# Patient Record
Sex: Male | Born: 1940 | Race: White | Hispanic: No | Marital: Married | Smoking: Never smoker
Health system: Southern US, Community
[De-identification: ages and names within clinical notes are randomized; demographics above are authoritative.]

## PROBLEM LIST (undated history)

## (undated) HISTORY — PX: ESOPHAGOGASTRODUODENOSCOPY ENDOSCOPY: SHX5814

---

## 2011-02-23 ENCOUNTER — Emergency Department (HOSPITAL_COMMUNITY): Payer: Medicare Other

## 2011-02-23 ENCOUNTER — Encounter: Payer: Self-pay | Admitting: Emergency Medicine

## 2011-02-23 ENCOUNTER — Emergency Department (HOSPITAL_COMMUNITY)
Admission: EM | Admit: 2011-02-23 | Discharge: 2011-02-24 | Disposition: A | Discharge status: Home / Self Care | Payer: Medicare Other | Attending: Emergency Medicine | Admitting: Emergency Medicine

## 2011-02-23 DIAGNOSIS — S81009A Unspecified open wound, unspecified knee, initial encounter: Secondary | ICD-10-CM | POA: Insufficient documentation

## 2011-02-23 DIAGNOSIS — S40019A Contusion of unspecified shoulder, initial encounter: Secondary | ICD-10-CM

## 2011-02-23 DIAGNOSIS — S0990XA Unspecified injury of head, initial encounter: Secondary | ICD-10-CM

## 2011-02-23 DIAGNOSIS — Y92009 Unspecified place in unspecified non-institutional (private) residence as the place of occurrence of the external cause: Secondary | ICD-10-CM | POA: Insufficient documentation

## 2011-02-23 DIAGNOSIS — M25519 Pain in unspecified shoulder: Secondary | ICD-10-CM | POA: Insufficient documentation

## 2011-02-23 DIAGNOSIS — S0180XA Unspecified open wound of other part of head, initial encounter: Secondary | ICD-10-CM | POA: Insufficient documentation

## 2011-02-23 DIAGNOSIS — S81812A Laceration without foreign body, left lower leg, initial encounter: Secondary | ICD-10-CM

## 2011-02-23 DIAGNOSIS — S0181XA Laceration without foreign body of other part of head, initial encounter: Secondary | ICD-10-CM

## 2011-02-23 DIAGNOSIS — T7411XA Adult physical abuse, confirmed, initial encounter: Secondary | ICD-10-CM | POA: Insufficient documentation

## 2011-02-23 MED ORDER — MORPHINE SULFATE 4 MG/ML IJ SOLN
4.0000 mg | Freq: Once | INTRAMUSCULAR | Status: DC
  Filled 2011-02-23: qty 1

## 2011-02-23 MED ORDER — LIDOCAINE HCL (PF) 1 % IJ SOLN: 5.0000 mL | Freq: Once | INTRAMUSCULAR | Status: DC

## 2011-02-23 MED ORDER — MORPHINE SULFATE 4 MG/ML IJ SOLN
4.0000 mg | Freq: Once | INTRAMUSCULAR | Status: AC
  Administered 2011-02-23: 4 mg via INTRAMUSCULAR

## 2011-02-23 MED ORDER — TETANUS-DIPHTH-ACELL PERTUSSIS 5-2.5-18.5 LF-MCG/0.5 IM SUSP
0.5000 mL | Freq: Once | INTRAMUSCULAR | Status: AC
  Administered 2011-02-23: 0.5 mL via INTRAMUSCULAR
  Filled 2011-02-23: qty 0.5

## 2011-02-23 MED ORDER — LIDOCAINE-EPINEPHRINE (PF) 1 %-1:200000 IJ SOLN
INTRAMUSCULAR | Status: AC
  Filled 2011-02-23: qty 10
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
## 2011-02-23 NOTE — ED Notes (Signed)
Patient ambulated to bathroom to clean arms and hands; c/o mild dizziness.

## 2011-02-24 MED ORDER — NAPROXEN 500 MG PO TABS: 500.0000 mg | ORAL_TABLET | Freq: Two times a day (BID) | ORAL | Status: AC

## 2011-02-24 NOTE — ED Notes (Signed)
Pt self ambualte out with a steady gait stating no needs

## 2014-07-27 ENCOUNTER — Encounter (HOSPITAL_COMMUNITY): Payer: Self-pay | Admitting: Emergency Medicine

## 2014-07-27 ENCOUNTER — Emergency Department (HOSPITAL_COMMUNITY)
Admission: EM | Admit: 2014-07-27 | Discharge: 2014-07-27 | Disposition: A | Discharge status: Home / Self Care | Payer: PPO | Attending: Emergency Medicine | Admitting: Emergency Medicine

## 2014-07-27 ENCOUNTER — Emergency Department (HOSPITAL_COMMUNITY): Payer: PPO

## 2014-07-27 DIAGNOSIS — S30811A Abrasion of abdominal wall, initial encounter: Secondary | ICD-10-CM | POA: Diagnosis not present

## 2014-07-27 DIAGNOSIS — Y9389 Activity, other specified: Secondary | ICD-10-CM | POA: Insufficient documentation

## 2014-07-27 DIAGNOSIS — Z23 Encounter for immunization: Secondary | ICD-10-CM | POA: Insufficient documentation

## 2014-07-27 DIAGNOSIS — Y998 Other external cause status: Secondary | ICD-10-CM | POA: Insufficient documentation

## 2014-07-27 DIAGNOSIS — S31139A Puncture wound of abdominal wall without foreign body, unspecified quadrant without penetration into peritoneal cavity, initial encounter: Secondary | ICD-10-CM | POA: Insufficient documentation

## 2014-07-27 DIAGNOSIS — Y9289 Other specified places as the place of occurrence of the external cause: Secondary | ICD-10-CM | POA: Diagnosis not present

## 2014-07-27 DIAGNOSIS — S51812A Laceration without foreign body of left forearm, initial encounter: Secondary | ICD-10-CM | POA: Insufficient documentation

## 2014-07-27 DIAGNOSIS — S51811A Laceration without foreign body of right forearm, initial encounter: Secondary | ICD-10-CM | POA: Insufficient documentation

## 2014-07-27 MED ORDER — TETANUS-DIPHTH-ACELL PERTUSSIS 5-2.5-18.5 LF-MCG/0.5 IM SUSP
0.5000 mL | Freq: Once | INTRAMUSCULAR | Status: AC
  Administered 2014-07-27: 0.5 mL via INTRAMUSCULAR
  Filled 2014-07-27: qty 0.5

## 2014-07-27 MED ORDER — ACETAMINOPHEN 325 MG PO TABS
650.0000 mg | ORAL_TABLET | Freq: Once | ORAL | Status: AC
  Administered 2014-07-27: 650 mg via ORAL
  Filled 2014-07-27: qty 2
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
## 2014-07-27 NOTE — Discharge Instructions (Signed)
Laceration Care, Adult °A laceration is a cut that goes through all layers of the skin. The cut goes into the tissue beneath the skin. °HOME CARE °For stitches (sutures) or staples: °· Keep the cut clean and dry. °· If you have a bandage (dressing), change it at least once a day. Change the bandage if it gets wet or dirty, or as told by your doctor. °· Wash the cut with soap and water 2 times a day. Rinse the cut with water. Pat it dry with a clean towel. °· Put a thin layer of medicated cream on the cut as told by your doctor. °· You may shower after the first 24 hours. Do not soak the cut in water until the stitches are removed. °· Only take medicines as told by your doctor. °· Have your stitches or staples removed as told by your doctor. °For skin adhesive strips: °· Keep the cut clean and dry. °· Do not get the strips wet. You may take a bath, but be careful to keep the cut dry. °· If the cut gets wet, pat it dry with a clean towel. °· The strips will fall off on their own. Do not remove the strips that are still stuck to the cut. °For wound glue: °· You may shower or take baths. Do not soak or scrub the cut. Do not swim. Avoid heavy sweating until the glue falls off on its own. After a shower or bath, pat the cut dry with a clean towel. °· Do not put medicine on your cut until the glue falls off. °· If you have a bandage, do not put tape over the glue. °· Avoid lots of sunlight or tanning lamps until the glue falls off. Put sunscreen on the cut for the first year to reduce your scar. °· The glue will fall off on its own. Do not pick at the glue. °You may need a tetanus shot if: °· You cannot remember when you had your last tetanus shot. °· You have never had a tetanus shot. °If you need a tetanus shot and you choose not to have one, you may get tetanus. Sickness from tetanus can be serious. °GET HELP RIGHT AWAY IF:  °· Your pain does not get better with medicine. °· Your arm, hand, leg, or foot loses feeling  (numbness) or changes color. °· Your cut is bleeding. °· Your joint feels weak, or you cannot use your joint. °· You have painful lumps on your body. °· Your cut is red, puffy (swollen), or painful. °· You have a red line on the skin near the cut. °· You have yellowish-white fluid (pus) coming from the cut. °· You have a fever. °· You have a bad smell coming from the cut or bandage. °· Your cut breaks open before or after stitches are removed. °· You notice something coming out of the cut, such as wood or glass. °· You cannot move a finger or toe. °MAKE SURE YOU:  °· Understand these instructions. °· Will watch your condition. °· Will get help right away if you are not doing well or get worse. °Document Released: 11/27/2007 Document Revised: 09/02/2011 Document Reviewed: 12/04/2010 °[employer]® Patient Information ©2015 [employer]. This information is not intended to replace advice given to you by your health care provider. Make sure you discuss any questions you have with your health care provider. ° °Sterile Tape Wound Care °Some cuts and wounds can be closed using sterile tape, also called skin adhesive   strips. Skin adhesive strips can be used for shallow (superficial) and simple cuts, wounds, lacerations, and surgical incisions. These strips act in place of stitches to hold the edges of the wound together, allowing for faster healing. Unlike stitches, the adhesive strips do not require needles or anesthetic medicine for placement. The strips will wear off naturally as the wound is healing. It is important to take proper care of your wound at home while it heals.  °HOME CARE INSTRUCTIONS °· Try to keep the area around your wound clean and dry. Do not allow the adhesive strips to get wet for the first 12 hours.   °· Do not use any soaps or ointments on the wound for the first 12 hours.   °· If a bandage (dressing) has been applied, follow your health care provider's instructions for how often to change the  dressing. Keep the dressing dry if one has been applied.   °· Do not remove the adhesive strips. They will fall off on their own. If they do not, you may remove them gently after 10 days. You should gently wet the strips before removing them. For example, this can be done in the shower. °· Do not scratch, pick, or rub the wound area.   °· Protect the wound from further injury until it is healed.   °· Protect the wound from sun and tanning bed exposure while it is healing and for several weeks after healing.   °· Only take over-the-counter or prescription medicines as directed by your health care provider.   °· Keep all follow-up appointments as directed by your health care provider.   °SEEK MEDICAL CARE IF: °Your adhesive strips become wet or soaked with blood before the wound has healed. The tape will need to be replaced.  °SEEK IMMEDIATE MEDICAL CARE IF: °· You have increasing pain in the wound.   °· You develop a rash after the strips are applied. °· Your wound becomes red, swollen, hot, or tender.   °· You have a red streak that goes away from the wound.   °· You have pus coming from the wound.   °· You have increased bleeding from the wound. °· You notice a bad smell coming from the wound.   °· Your wound breaks open. °MAKE SURE YOU: °· Understand these instructions. °· Will watch your condition. °· Will get help right away if you are not doing well or get worse. °Document Released: 07/18/2004 Document Revised: 03/31/2013 Document Reviewed: 12/30/2012 °Exit[employer]® Patient Information ©2015 [employer]. This information is not intended to replace advice given to you by your health care provider. Make sure you discuss any questions you have with your health care provider. ° °
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
## 2014-08-23 ENCOUNTER — Telehealth: Payer: Self-pay | Admitting: Family Medicine

## 2014-08-25 ENCOUNTER — Telehealth: Payer: Self-pay | Admitting: Family Medicine

## 2014-08-25 NOTE — Telephone Encounter (Signed)
Patient is going to call us back if he decides he wants to be seen.

## 2014-08-26 NOTE — Telephone Encounter (Signed)
Pt given new pt appt with Dr Darlyn ReadStacks 4/15 at 10:10. Pt aware to arrive 15 minutes prior with a copy of insurance card, pt not on any current meds.

## 2014-10-07 ENCOUNTER — Ambulatory Visit: Payer: Self-pay | Admitting: Family Medicine

## 2015-01-07 ENCOUNTER — Emergency Department (HOSPITAL_COMMUNITY)
Admission: EM | Admit: 2015-01-07 | Discharge: 2015-01-08 | Disposition: A | Discharge status: Home / Self Care | Payer: PPO | Attending: Emergency Medicine | Admitting: Emergency Medicine

## 2015-01-07 ENCOUNTER — Encounter (HOSPITAL_COMMUNITY): Payer: Self-pay | Admitting: *Deleted

## 2015-01-07 DIAGNOSIS — Y998 Other external cause status: Secondary | ICD-10-CM | POA: Insufficient documentation

## 2015-01-07 DIAGNOSIS — S41101A Unspecified open wound of right upper arm, initial encounter: Secondary | ICD-10-CM | POA: Diagnosis not present

## 2015-01-07 DIAGNOSIS — S3992XA Unspecified injury of lower back, initial encounter: Secondary | ICD-10-CM | POA: Insufficient documentation

## 2015-01-07 DIAGNOSIS — S01112A Laceration without foreign body of left eyelid and periocular area, initial encounter: Secondary | ICD-10-CM | POA: Insufficient documentation

## 2015-01-07 DIAGNOSIS — S3991XA Unspecified injury of abdomen, initial encounter: Secondary | ICD-10-CM | POA: Diagnosis not present

## 2015-01-07 DIAGNOSIS — Y9289 Other specified places as the place of occurrence of the external cause: Secondary | ICD-10-CM | POA: Diagnosis not present

## 2015-01-07 DIAGNOSIS — S20212A Contusion of left front wall of thorax, initial encounter: Secondary | ICD-10-CM | POA: Insufficient documentation

## 2015-01-07 DIAGNOSIS — S29092A Other injury of muscle and tendon of back wall of thorax, initial encounter: Secondary | ICD-10-CM | POA: Diagnosis not present

## 2015-01-07 DIAGNOSIS — S4992XA Unspecified injury of left shoulder and upper arm, initial encounter: Secondary | ICD-10-CM | POA: Diagnosis present

## 2015-01-07 DIAGNOSIS — T148XXA Other injury of unspecified body region, initial encounter: Secondary | ICD-10-CM

## 2015-01-07 DIAGNOSIS — T07XXXA Unspecified multiple injuries, initial encounter: Secondary | ICD-10-CM

## 2015-01-07 DIAGNOSIS — S41102A Unspecified open wound of left upper arm, initial encounter: Secondary | ICD-10-CM | POA: Diagnosis not present

## 2015-01-07 DIAGNOSIS — S0181XA Laceration without foreign body of other part of head, initial encounter: Secondary | ICD-10-CM

## 2015-01-07 DIAGNOSIS — S5002XA Contusion of left elbow, initial encounter: Secondary | ICD-10-CM | POA: Insufficient documentation

## 2015-01-07 DIAGNOSIS — Y9389 Activity, other specified: Secondary | ICD-10-CM | POA: Diagnosis not present

## 2015-01-07 DIAGNOSIS — S0990XA Unspecified injury of head, initial encounter: Secondary | ICD-10-CM

## 2015-01-07 MED ORDER — BACITRACIN-POLYMYXIN B 500-10000 UNIT/GM OP OINT
TOPICAL_OINTMENT | OPHTHALMIC | Status: AC
  Administered 2015-01-08
  Filled 2015-01-07: qty 3.5
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
## 2015-01-08 ENCOUNTER — Emergency Department (HOSPITAL_COMMUNITY): Payer: PPO

## 2015-01-08 LAB — URINALYSIS, ROUTINE W REFLEX MICROSCOPIC
Bilirubin Urine: NEGATIVE
GLUCOSE, UA: NEGATIVE mg/dL
KETONES UR: NEGATIVE mg/dL
Leukocytes, UA: NEGATIVE
Nitrite: NEGATIVE
PROTEIN: NEGATIVE mg/dL
Specific Gravity, Urine: 1.02 (ref 1.005–1.030)
UROBILINOGEN UA: 0.2 mg/dL (ref 0.0–1.0)
pH: 5.5 (ref 5.0–8.0)

## 2015-01-08 LAB — COMPREHENSIVE METABOLIC PANEL
ALBUMIN: 4.2 g/dL (ref 3.5–5.0)
ALT: 20 U/L (ref 17–63)
AST: 25 U/L (ref 15–41)
Alkaline Phosphatase: 71 U/L (ref 38–126)
Anion gap: 9 (ref 5–15)
BILIRUBIN TOTAL: 0.8 mg/dL (ref 0.3–1.2)
BUN: 18 mg/dL (ref 6–20)
CHLORIDE: 105 mmol/L (ref 101–111)
CO2: 24 mmol/L (ref 22–32)
CREATININE: 1.22 mg/dL (ref 0.61–1.24)
Calcium: 8.8 mg/dL — ABNORMAL LOW (ref 8.9–10.3)
GFR calc Af Amer: >60 mL/min (ref >60)
GFR, EST NON AFRICAN AMERICAN: 56 mL/min — AB (ref >60)
Glucose, Bld: 111 mg/dL — ABNORMAL HIGH (ref 65–99)
POTASSIUM: 4.2 mmol/L (ref 3.5–5.1)
SODIUM: 138 mmol/L (ref 135–145)
Total Protein: 7.3 g/dL (ref 6.5–8.1)

## 2015-01-08 LAB — URINE MICROSCOPIC-ADD ON

## 2015-01-08 LAB — PROTIME-INR
INR: 1.06 (ref 0.00–1.49)
PROTHROMBIN TIME: 14 s (ref 11.6–15.2)

## 2015-01-08 LAB — CBC
HCT: 44.1 % (ref 39.0–52.0)
HEMOGLOBIN: 15 g/dL (ref 13.0–17.0)
MCH: 31.4 pg (ref 26.0–34.0)
MCHC: 34 g/dL (ref 30.0–36.0)
MCV: 92.5 fL (ref 78.0–100.0)
PLATELETS: 172 10*3/uL (ref 150–400)
RBC: 4.77 MIL/uL (ref 4.22–5.81)
RDW: 13.7 % (ref 11.5–15.5)
WBC: 9.7 10*3/uL (ref 4.0–10.5)

## 2015-01-08 LAB — SAMPLE TO BLOOD BANK

## 2015-01-08 MED ORDER — SODIUM CHLORIDE 0.9 % IV SOLN
INTRAVENOUS | Status: DC
  Administered 2015-01-08: via INTRAVENOUS

## 2015-01-08 MED ORDER — ONDANSETRON 4 MG PO TBDP: 4.0000 mg | ORAL_TABLET | Freq: Three times a day (TID) | ORAL | Status: DC | PRN

## 2015-01-08 MED ORDER — ONDANSETRON HCL 4 MG/2ML IJ SOLN
4.0000 mg | Freq: Once | INTRAMUSCULAR | Status: AC
Start: 2015-01-08 — End: 2015-01-08
  Administered 2015-01-08: 4 mg via INTRAVENOUS
  Filled 2015-01-08: qty 2

## 2015-01-08 MED ORDER — DOCUSATE SODIUM 100 MG PO CAPS: 100.0000 mg | ORAL_CAPSULE | Freq: Two times a day (BID) | ORAL | Status: DC

## 2015-01-08 MED ORDER — LIDOCAINE HCL (PF) 1 % IJ SOLN
INTRAMUSCULAR | Status: AC
  Administered 2015-01-08: 01:00:00
  Filled 2015-01-08: qty 5

## 2015-01-08 MED ORDER — FENTANYL CITRATE (PF) 100 MCG/2ML IJ SOLN
50.0000 ug | Freq: Once | INTRAMUSCULAR | Status: AC
  Administered 2015-01-08: 50 ug via INTRAVENOUS
  Filled 2015-01-08: qty 2

## 2015-01-08 MED ORDER — HYDROCODONE-ACETAMINOPHEN 5-325 MG PO TABS: 1.0000 | ORAL_TABLET | Freq: Four times a day (QID) | ORAL | Status: DC | PRN

## 2015-01-08 MED ORDER — LIDOCAINE-EPINEPHRINE (PF) 1 %-1:200000 IJ SOLN
10.0000 mL | Freq: Once | INTRAMUSCULAR | Status: AC
  Administered 2015-01-08: 10 mL via INTRADERMAL

## 2015-01-08 MED ORDER — IOHEXOL 300 MG/ML  SOLN
100.0000 mL | Freq: Once | INTRAMUSCULAR | Status: AC | PRN
  Administered 2015-01-08: 100 mL via INTRAVENOUS

## 2015-01-08 NOTE — Discharge Instructions (Signed)
Contusion A contusion is a deep bruise. Contusions are the result of an injury that caused bleeding under the skin. The contusion may turn blue, purple, or yellow. Minor injuries will give you a painless contusion, but more severe contusions may stay painful and swollen for a few weeks.  CAUSES  A contusion is usually caused by a blow, trauma, or direct force to an area of the body. SYMPTOMS   Swelling and redness of the injured area.  Bruising of the injured area.  Tenderness and soreness of the injured area.  Pain. DIAGNOSIS  The diagnosis can be made by taking a history and physical exam. An X-ray, CT scan, or MRI may be needed to determine if there were any associated injuries, such as fractures. TREATMENT  Specific treatment will depend on what area of the body was injured. In general, the best treatment for a contusion is resting, icing, elevating, and applying cold compresses to the injured area. Over-the-counter medicines may also be recommended for pain control. Ask your caregiver what the best treatment is for your contusion. HOME CARE INSTRUCTIONS   Put ice on the injured area.  Put ice in a plastic bag.  Place a towel between your skin and the bag.  Leave the ice on for 15-20 minutes, 3-4 times a day, or as directed by your health care provider.  Only take over-the-counter or prescription medicines for pain, discomfort, or fever as directed by your caregiver. Your caregiver may recommend avoiding anti-inflammatory medicines (aspirin, ibuprofen, and naproxen) for 48 hours because these medicines may increase bruising.  Rest the injured area.  If possible, elevate the injured area to reduce swelling. SEEK IMMEDIATE MEDICAL CARE IF:   You have increased bruising or swelling.  You have pain that is getting worse.  Your swelling or pain is not relieved with medicines. MAKE SURE YOU:   Understand these instructions.  Will watch your condition.  Will get help right  away if you are not doing well or get worse. Document Released: 03/20/2005 Document Revised: 06/15/2013 Document Reviewed: 04/15/2011 [facility] Patient Information 2015 [place], [place]. This information is not intended to replace advice given to you by your health care provider. Make sure you discuss any questions you have with your health care provider.  Head Injury You have received a head injury. It does not appear serious at this time. Headaches and vomiting are common following head injury. It should be easy to awaken from sleeping. Sometimes it is necessary for you to stay in the emergency department for a while for observation. Sometimes admission to the hospital may be needed. After injuries such as yours, most problems occur within the first 24 hours, but side effects may occur up to 7-10 days after the injury. It is important for you to carefully monitor your condition and contact your health care provider or seek immediate medical care if there is a change in your condition. WHAT ARE THE TYPES OF HEAD INJURIES? Head injuries can be as minor as a bump. Some head injuries can be more severe. More severe head injuries include:  A jarring injury to the brain (concussion).  A bruise of the brain (contusion). This mean there is bleeding in the brain that can cause swelling.  A cracked skull (skull fracture).  Bleeding in the brain that collects, clots, and forms a bump (hematoma). WHAT CAUSES A HEAD INJURY? A serious head injury is most likely to happen to someone who is in a car wreck and is not wearing  a seat belt. Other causes of major head injuries include bicycle or motorcycle accidents, sports injuries, and falls. HOW ARE HEAD INJURIES DIAGNOSED? A complete history of the event leading to the injury and your current symptoms will be helpful in diagnosing head injuries. Many times, pictures of the brain, such as CT or MRI are needed to see the extent of the injury. Often, an overnight  hospital stay is necessary for observation.  WHEN SHOULD I SEEK IMMEDIATE MEDICAL CARE?  You should get help right away if:  You have confusion or drowsiness.  You feel sick to your stomach (nauseous) or have continued, forceful vomiting.  You have dizziness or unsteadiness that is getting worse.  You have severe, continued headaches not relieved by medicine. Only take over-the-counter or prescription medicines for pain, fever, or discomfort as directed by your health care provider.  You do not have normal function of the arms or legs or are unable to walk.  You notice changes in the black spots in the center of the colored part of your eye (pupil).  You have a clear or bloody fluid coming from your nose or ears.  You have a loss of vision. During the next 24 hours after the injury, you must stay with someone who can watch you for the warning signs. This person should contact local emergency services (911 in the [place] if you have seizures, you become unconscious, or you are unable to wake up. HOW CAN I PREVENT A HEAD INJURY IN THE FUTURE? The most important factor for preventing major head injuries is avoiding motor vehicle accidents. To minimize the potential for damage to your head, it is crucial to wear seat belts while riding in motor vehicles. Wearing helmets while bike riding and playing collision sports (like football) is also helpful. Also, avoiding dangerous activities around the house will further help reduce your risk of head injury.  WHEN CAN I RETURN TO NORMAL ACTIVITIES AND ATHLETICS? You should be reevaluated by your health care provider before returning to these activities. If you have any of the following symptoms, you should not return to activities or contact sports until 1 week after the symptoms have stopped:  Persistent headache.  Dizziness or vertigo.  Poor attention and concentration.  Confusion.  Memory problems.  Nausea or vomiting.  Fatigue or tire  easily.  Irritability.  Intolerant of bright lights or loud noises.  Anxiety or depression.  Disturbed sleep. MAKE SURE YOU:   Understand these instructions.  Will watch your condition.  Will get help right away if you are not doing well or get worse. Document Released: 06/10/2005 Document Revised: 06/15/2013 Document Reviewed: 02/15/2013 [facility] Patient Information 2015 [place], [place]. This information is not intended to replace advice given to you by your health care provider. Make sure you discuss any questions you have with your health care provider.  Facial Laceration  A facial laceration is a cut on the face. These injuries can be painful and cause bleeding. Lacerations usually heal quickly, but they need special care to reduce scarring. DIAGNOSIS  Your health care provider will take a medical history, ask for details about how the injury occurred, and examine the wound to determine how deep the cut is. TREATMENT  Some facial lacerations may not require closure. Others may not be able to be closed because of an increased risk of infection. The risk of infection and the chance for successful closure will depend on various factors, including the amount of time since the injury  occurred. The wound may be cleaned to help prevent infection. If closure is appropriate, pain medicines may be given if needed. Your health care provider will use stitches (sutures), wound glue (adhesive), or skin adhesive strips to repair the laceration. These tools bring the skin edges together to allow for faster healing and a better cosmetic outcome. If needed, you may also be given a tetanus shot. HOME CARE INSTRUCTIONS  Only take over-the-counter or prescription medicines as directed by your health care provider.  Follow your health care provider's instructions for wound care. These instructions will vary depending on the technique used for closing the wound. For Sutures:  Keep the wound clean and  dry.   If you were given a bandage (dressing), you should change it at least once a day. Also change the dressing if it becomes wet or dirty, or as directed by your health care provider.   Wash the wound with soap and water 2 times a day. Rinse the wound off with water to remove all soap. Pat the wound dry with a clean towel.   After cleaning, apply a thin layer of the antibiotic ointment recommended by your health care provider. This will help prevent infection and keep the dressing from sticking.   You may shower as usual after the first 24 hours. Do not soak the wound in water until the sutures are removed.   Get your sutures removed as directed by your health care provider. With facial lacerations, sutures should usually be taken out after 4-5 days to avoid stitch marks.   Wait a few days after your sutures are removed before applying any makeup. For Skin Adhesive Strips:  Keep the wound clean and dry.   Do not get the skin adhesive strips wet. You may bathe carefully, using caution to keep the wound dry.   If the wound gets wet, pat it dry with a clean towel.   Skin adhesive strips will fall off on their own. You may trim the strips as the wound heals. Do not remove skin adhesive strips that are still stuck to the wound. They will fall off in time.  For Wound Adhesive:  You may briefly wet your wound in the shower or bath. Do not soak or scrub the wound. Do not swim. Avoid periods of heavy sweating until the skin adhesive has fallen off on its own. After showering or bathing, gently pat the wound dry with a clean towel.   Do not apply liquid medicine, cream medicine, ointment medicine, or makeup to your wound while the skin adhesive is in place. This may loosen the film before your wound is healed.   If a dressing is placed over the wound, be careful not to apply tape directly over the skin adhesive. This may cause the adhesive to be pulled off before the wound is healed.    Avoid prolonged exposure to sunlight or tanning lamps while the skin adhesive is in place.  The skin adhesive will usually remain in place for 5-10 days, then naturally fall off the skin. Do not pick at the adhesive film.  After Healing: Once the wound has healed, cover the wound with sunscreen during the day for 1 full year. This can help minimize scarring. Exposure to ultraviolet light in the first year will darken the scar. It can take 1-2 years for the scar to lose its redness and to heal completely.  SEEK IMMEDIATE MEDICAL CARE IF:  You have redness, pain, or swelling around the wound.  You see ayellowish-white fluid (pus) coming from the wound.   You have chills or a fever.  MAKE SURE YOU:  Understand these instructions.  Will watch your condition.  Will get help right away if you are not doing well or get worse. Document Released: 07/18/2004 Document Revised: 03/31/2013 Document Reviewed: 01/21/2013 [facility] Patient Information 2015 [place], [place]. This information is not intended to replace advice given to you by your health care provider. Make sure you discuss any questions you have with your health care provider.   RICE: Routine Care for Injuries The routine care of many injuries includes Rest, Ice, Compression, and Elevation (RICE). HOME CARE INSTRUCTIONS  Rest is needed to allow your body to heal. Routine activities can usually be resumed when comfortable. Injured tendons and bones can take up to 6 weeks to heal. Tendons are the cord-like structures that attach muscle to bone.  Ice following an injury helps keep the swelling down and reduces pain.  Put ice in a plastic bag.  Place a towel between your skin and the bag.  Leave the ice on for 15-20 minutes, 3-4 times a day, or as directed by your health care provider. Do this while awake, for the first 24 to 48 hours. After that, continue as directed by your caregiver.  Compression helps keep swelling down.  It also gives support and helps with discomfort. If an elastic bandage has been applied, it should be removed and reapplied every 3 to 4 hours. It should not be applied tightly, but firmly enough to keep swelling down. Watch fingers or toes for swelling, bluish discoloration, coldness, numbness, or excessive pain. If any of these problems occur, remove the bandage and reapply loosely. Contact your caregiver if these problems continue.  Elevation helps reduce swelling and decreases pain. With extremities, such as the arms, hands, legs, and feet, the injured area should be placed near or above the level of the heart, if possible. SEEK IMMEDIATE MEDICAL CARE IF:  You have persistent pain and swelling.  You develop redness, numbness, or unexpected weakness.  Your symptoms are getting worse rather than improving after several days. These symptoms may indicate that further evaluation or further X-rays are needed. Sometimes, X-rays may not show a small broken bone (fracture) until 1 week or 10 days later. Make a follow-up appointment with your caregiver. Ask when your X-ray results will be ready. Make sure you get your X-ray results. Document Released: 09/22/2000 Document Revised: 06/15/2013 Document Reviewed: 11/09/2010 [facility] Patient Information 2015 [place], [place]. This information is not intended to replace advice given to you by your health care provider. Make sure you discuss any questions you have with your health care provider.
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
## 2015-01-12 ENCOUNTER — Encounter (HOSPITAL_COMMUNITY): Payer: Self-pay | Admitting: Emergency Medicine

## 2015-01-12 ENCOUNTER — Emergency Department (HOSPITAL_COMMUNITY)
Admission: EM | Admit: 2015-01-12 | Discharge: 2015-01-12 | Disposition: A | Discharge status: Home / Self Care | Payer: PPO | Attending: Emergency Medicine | Admitting: Emergency Medicine

## 2015-01-12 DIAGNOSIS — Z4802 Encounter for removal of sutures: Secondary | ICD-10-CM | POA: Insufficient documentation

## 2015-01-12 NOTE — Discharge Instructions (Signed)
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
## 2016-01-31 ENCOUNTER — Encounter (HOSPITAL_COMMUNITY): Payer: Self-pay | Admitting: *Deleted

## 2016-01-31 ENCOUNTER — Emergency Department (HOSPITAL_COMMUNITY): Payer: PPO

## 2016-01-31 ENCOUNTER — Emergency Department (HOSPITAL_COMMUNITY)
Admission: EM | Admit: 2016-01-31 | Discharge: 2016-01-31 | Disposition: A | Discharge status: Home / Self Care | Payer: PPO | Attending: Emergency Medicine | Admitting: Emergency Medicine

## 2016-01-31 DIAGNOSIS — R0789 Other chest pain: Secondary | ICD-10-CM | POA: Diagnosis not present

## 2016-01-31 DIAGNOSIS — Z79899 Other long term (current) drug therapy: Secondary | ICD-10-CM | POA: Insufficient documentation

## 2016-01-31 DIAGNOSIS — M5412 Radiculopathy, cervical region: Secondary | ICD-10-CM | POA: Diagnosis not present

## 2016-01-31 DIAGNOSIS — R079 Chest pain, unspecified: Secondary | ICD-10-CM | POA: Diagnosis not present

## 2016-01-31 DIAGNOSIS — M542 Cervicalgia: Secondary | ICD-10-CM | POA: Diagnosis not present

## 2016-01-31 LAB — CBC
HCT: 42.6 % (ref 39.0–52.0)
HEMOGLOBIN: 14.3 g/dL (ref 13.0–17.0)
MCH: 31.2 pg (ref 26.0–34.0)
MCHC: 33.6 g/dL (ref 30.0–36.0)
MCV: 92.8 fL (ref 78.0–100.0)
Platelets: 151 10*3/uL (ref 150–400)
RBC: 4.59 MIL/uL (ref 4.22–5.81)
RDW: 14.9 % (ref 11.5–15.5)
WBC: 6.7 10*3/uL (ref 4.0–10.5)

## 2016-01-31 LAB — BASIC METABOLIC PANEL
ANION GAP: 7 (ref 5–15)
BUN: 14 mg/dL (ref 6–20)
CHLORIDE: 109 mmol/L (ref 101–111)
CO2: 22 mmol/L (ref 22–32)
Calcium: 9 mg/dL (ref 8.9–10.3)
Creatinine, Ser: 1.05 mg/dL (ref 0.61–1.24)
GFR calc Af Amer: >60 mL/min (ref >60)
GFR calc non Af Amer: >60 mL/min (ref >60)
Glucose, Bld: 117 mg/dL — ABNORMAL HIGH (ref 65–99)
POTASSIUM: 3.8 mmol/L (ref 3.5–5.1)
SODIUM: 138 mmol/L (ref 135–145)

## 2016-01-31 LAB — TROPONIN I: Troponin I: <0.03 ng/mL (ref <0.03)

## 2016-01-31 MED ORDER — DEXAMETHASONE SODIUM PHOSPHATE 10 MG/ML IJ SOLN
10.0000 mg | Freq: Once | INTRAMUSCULAR | Status: AC
  Administered 2016-01-31: 10 mg via INTRAMUSCULAR
  Filled 2016-01-31: qty 1

## 2016-01-31 MED ORDER — PREDNISONE 10 MG (21) PO TBPK: 10.0000 mg | ORAL_TABLET | Freq: Every day | ORAL | 0 refills | Status: DC

## 2016-01-31 MED ORDER — DEXAMETHASONE SODIUM PHOSPHATE 10 MG/ML IJ SOLN: 10.0000 mg | Freq: Once | INTRAMUSCULAR | Status: DC
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
## 2016-01-31 NOTE — ED Triage Notes (Signed)
Pt comes in with intermittent chest pain that he says comes in "spells". Pt states his pain moves up his right arm, around his head, down his left arm, and into his chest. Pt denies having this pain right now.
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
## 2018-07-27 DIAGNOSIS — H2513 Age-related nuclear cataract, bilateral: Secondary | ICD-10-CM | POA: Diagnosis not present

## 2018-07-27 DIAGNOSIS — H02403 Unspecified ptosis of bilateral eyelids: Secondary | ICD-10-CM | POA: Diagnosis not present

## 2018-07-27 DIAGNOSIS — H02831 Dermatochalasis of right upper eyelid: Secondary | ICD-10-CM | POA: Diagnosis not present

## 2018-07-27 DIAGNOSIS — Z01818 Encounter for other preprocedural examination: Secondary | ICD-10-CM | POA: Diagnosis not present

## 2018-08-20 DIAGNOSIS — H02831 Dermatochalasis of right upper eyelid: Secondary | ICD-10-CM | POA: Diagnosis not present

## 2018-11-03 DIAGNOSIS — H02831 Dermatochalasis of right upper eyelid: Secondary | ICD-10-CM | POA: Diagnosis not present

## 2018-11-03 DIAGNOSIS — Z01818 Encounter for other preprocedural examination: Secondary | ICD-10-CM | POA: Diagnosis not present

## 2018-11-12 DIAGNOSIS — H02831 Dermatochalasis of right upper eyelid: Secondary | ICD-10-CM | POA: Diagnosis not present

## 2018-11-12 DIAGNOSIS — H02834 Dermatochalasis of left upper eyelid: Secondary | ICD-10-CM | POA: Diagnosis not present

## 2018-11-12 DIAGNOSIS — H02403 Unspecified ptosis of bilateral eyelids: Secondary | ICD-10-CM | POA: Diagnosis not present

## 2019-01-21 ENCOUNTER — Other Ambulatory Visit: Payer: Self-pay

## 2019-09-15 ENCOUNTER — Ambulatory Visit: Payer: Medicare Other | Admitting: Family Medicine

## 2019-09-17 ENCOUNTER — Encounter: Payer: Self-pay | Admitting: General Practice

## 2022-04-03 ENCOUNTER — Encounter (HOSPITAL_COMMUNITY): Payer: Self-pay | Admitting: Emergency Medicine

## 2022-04-03 ENCOUNTER — Emergency Department (HOSPITAL_COMMUNITY): Payer: Medicare Other

## 2022-04-03 ENCOUNTER — Other Ambulatory Visit: Payer: Self-pay

## 2022-04-03 ENCOUNTER — Encounter (HOSPITAL_COMMUNITY): Payer: Self-pay

## 2022-04-03 ENCOUNTER — Observation Stay (HOSPITAL_COMMUNITY)
Admission: EM | Admit: 2022-04-03 | Discharge: 2022-04-05 | Disposition: A | Discharge status: Home / Self Care | Payer: Medicare Other | Attending: Internal Medicine | Admitting: Internal Medicine

## 2022-04-03 DIAGNOSIS — H538 Other visual disturbances: Secondary | ICD-10-CM | POA: Diagnosis not present

## 2022-04-03 DIAGNOSIS — H532 Diplopia: Secondary | ICD-10-CM | POA: Diagnosis not present

## 2022-04-03 DIAGNOSIS — G459 Transient cerebral ischemic attack, unspecified: Secondary | ICD-10-CM | POA: Diagnosis not present

## 2022-04-03 DIAGNOSIS — R079 Chest pain, unspecified: Secondary | ICD-10-CM | POA: Insufficient documentation

## 2022-04-03 DIAGNOSIS — E782 Mixed hyperlipidemia: Secondary | ICD-10-CM | POA: Diagnosis not present

## 2022-04-03 DIAGNOSIS — K219 Gastro-esophageal reflux disease without esophagitis: Secondary | ICD-10-CM | POA: Diagnosis not present

## 2022-04-03 DIAGNOSIS — Z79899 Other long term (current) drug therapy: Secondary | ICD-10-CM | POA: Diagnosis not present

## 2022-04-03 DIAGNOSIS — H02401 Unspecified ptosis of right eyelid: Secondary | ICD-10-CM | POA: Diagnosis not present

## 2022-04-03 DIAGNOSIS — E778 Other disorders of glycoprotein metabolism: Secondary | ICD-10-CM | POA: Insufficient documentation

## 2022-04-03 DIAGNOSIS — R200 Anesthesia of skin: Secondary | ICD-10-CM | POA: Insufficient documentation

## 2022-04-03 DIAGNOSIS — R29810 Facial weakness: Secondary | ICD-10-CM | POA: Insufficient documentation

## 2022-04-03 DIAGNOSIS — D696 Thrombocytopenia, unspecified: Secondary | ICD-10-CM | POA: Diagnosis not present

## 2022-04-03 DIAGNOSIS — F17200 Nicotine dependence, unspecified, uncomplicated: Secondary | ICD-10-CM

## 2022-04-03 LAB — I-STAT CHEM 8, ED
BUN: 15 mg/dL (ref 8–23)
Calcium, Ion: 1.17 mmol/L (ref 1.15–1.40)
Chloride: 106 mmol/L (ref 98–111)
Creatinine, Ser: 1.2 mg/dL (ref 0.61–1.24)
Glucose, Bld: 99 mg/dL (ref 70–99)
HCT: 43 % (ref 39.0–52.0)
Hemoglobin: 14.6 g/dL (ref 13.0–17.0)
Potassium: 3.7 mmol/L (ref 3.5–5.1)
Sodium: 140 mmol/L (ref 135–145)
TCO2: 22 mmol/L (ref 22–32)

## 2022-04-03 LAB — CBC
HCT: 43 % (ref 39.0–52.0)
Hemoglobin: 14.7 g/dL (ref 13.0–17.0)
MCH: 29.8 pg (ref 26.0–34.0)
MCHC: 34.2 g/dL (ref 30.0–36.0)
MCV: 87.2 fL (ref 80.0–100.0)
Platelets: 179 10*3/uL (ref 150–400)
RBC: 4.93 MIL/uL (ref 4.22–5.81)
RDW: 14.4 % (ref 11.5–15.5)
WBC: 7.7 10*3/uL (ref 4.0–10.5)
nRBC: 0 % (ref 0.0–0.2)

## 2022-04-03 LAB — URINALYSIS, ROUTINE W REFLEX MICROSCOPIC
Bilirubin Urine: NEGATIVE
Glucose, UA: NEGATIVE mg/dL
Ketones, ur: NEGATIVE mg/dL
Leukocytes,Ua: NEGATIVE
Nitrite: NEGATIVE
Protein, ur: NEGATIVE mg/dL
Specific Gravity, Urine: 1.003 — ABNORMAL LOW (ref 1.005–1.030)
pH: 5 (ref 5.0–8.0)

## 2022-04-03 LAB — DIFFERENTIAL
Abs Immature Granulocytes: 0.03 10*3/uL (ref 0.00–0.07)
Basophils Absolute: 0 10*3/uL (ref 0.0–0.1)
Basophils Relative: 0 %
Eosinophils Absolute: 0.2 10*3/uL (ref 0.0–0.5)
Eosinophils Relative: 3 %
Immature Granulocytes: 0 %
Lymphocytes Relative: 19 %
Lymphs Abs: 1.4 10*3/uL (ref 0.7–4.0)
Monocytes Absolute: 0.6 10*3/uL (ref 0.1–1.0)
Monocytes Relative: 8 %
Neutro Abs: 5.4 10*3/uL (ref 1.7–7.7)
Neutrophils Relative %: 70 %

## 2022-04-03 LAB — COMPREHENSIVE METABOLIC PANEL
ALT: 15 U/L (ref 0–44)
AST: 20 U/L (ref 15–41)
Albumin: 4.2 g/dL (ref 3.5–5.0)
Alkaline Phosphatase: 66 U/L (ref 38–126)
Anion gap: 8 (ref 5–15)
BUN: 16 mg/dL (ref 8–23)
CO2: 23 mmol/L (ref 22–32)
Calcium: 9.1 mg/dL (ref 8.9–10.3)
Chloride: 107 mmol/L (ref 98–111)
Creatinine, Ser: 1.23 mg/dL (ref 0.61–1.24)
GFR, Estimated: 59 mL/min — ABNORMAL LOW (ref >60)
Glucose, Bld: 100 mg/dL — ABNORMAL HIGH (ref 70–99)
Potassium: 3.7 mmol/L (ref 3.5–5.1)
Sodium: 138 mmol/L (ref 135–145)
Total Bilirubin: 1.2 mg/dL (ref 0.3–1.2)
Total Protein: 7.4 g/dL (ref 6.5–8.1)

## 2022-04-03 LAB — APTT: aPTT: 24 seconds (ref 24–36)

## 2022-04-03 LAB — RAPID URINE DRUG SCREEN, HOSP PERFORMED
Amphetamines: NOT DETECTED
Barbiturates: NOT DETECTED
Benzodiazepines: NOT DETECTED
Cocaine: NOT DETECTED
Opiates: NOT DETECTED
Tetrahydrocannabinol: NOT DETECTED

## 2022-04-03 LAB — PROTIME-INR
INR: 1 (ref 0.8–1.2)
Prothrombin Time: 13.5 seconds (ref 11.4–15.2)

## 2022-04-03 LAB — TROPONIN I (HIGH SENSITIVITY): Troponin I (High Sensitivity): 5 ng/L (ref <18)

## 2022-04-03 LAB — CBG MONITORING, ED: Glucose-Capillary: 97 mg/dL (ref 70–99)

## 2022-04-03 LAB — ETHANOL: Alcohol, Ethyl (B): <10 mg/dL (ref <10)

## 2022-04-03 MED ORDER — STROKE: EARLY STAGES OF RECOVERY BOOK
Freq: Once | Status: AC
  Filled 2022-04-03: qty 1

## 2022-04-03 MED ORDER — SENNOSIDES-DOCUSATE SODIUM 8.6-50 MG PO TABS: 1.0000 | ORAL_TABLET | Freq: Every evening | ORAL | Status: DC | PRN

## 2022-04-03 MED ORDER — ACETAMINOPHEN 650 MG RE SUPP: 650.0000 mg | RECTAL | Status: DC | PRN

## 2022-04-03 MED ORDER — HEPARIN SODIUM (PORCINE) 5000 UNIT/ML IJ SOLN
5000.0000 [IU] | Freq: Three times a day (TID) | INTRAMUSCULAR | Status: DC
  Administered 2022-04-04 – 2022-04-05 (×2): 5000 [IU] via SUBCUTANEOUS
  Filled 2022-04-03 (×2): qty 1

## 2022-04-03 MED ORDER — ACETAMINOPHEN 160 MG/5ML PO SOLN: 650.0000 mg | ORAL | Status: DC | PRN

## 2022-04-03 MED ORDER — HEPARIN SODIUM (PORCINE) 5000 UNIT/ML IJ SOLN: 5000.0000 [IU] | Freq: Three times a day (TID) | INTRAMUSCULAR | Status: DC

## 2022-04-03 MED ORDER — GADOBUTROL 1 MMOL/ML IV SOLN
8.0000 mL | Freq: Once | INTRAVENOUS | Status: AC | PRN
  Administered 2022-04-03: 8 mL via INTRAVENOUS

## 2022-04-03 MED ORDER — LACTATED RINGERS IV BOLUS
1000.0000 mL | Freq: Once | INTRAVENOUS | Status: AC
  Administered 2022-04-03: 1000 mL via INTRAVENOUS

## 2022-04-03 MED ORDER — MECLIZINE HCL 12.5 MG PO TABS
12.5000 mg | ORAL_TABLET | Freq: Once | ORAL | Status: AC
  Administered 2022-04-03: 12.5 mg via ORAL
  Filled 2022-04-03: qty 1

## 2022-04-03 MED ORDER — ACETAMINOPHEN 325 MG PO TABS: 650.0000 mg | ORAL_TABLET | ORAL | Status: DC | PRN

## 2022-04-03 NOTE — ED Notes (Signed)
No CT at this time, pt to MRI instead.

## 2022-04-03 NOTE — ED Notes (Signed)
Pt reports no change in symptoms, reports right arm pain, chest pain and eye pain.

## 2022-04-03 NOTE — Consult Note (Addendum)
Code stroke activated at 1747 for R face/arm numbness and R eye blurry vision. LKW 1500. Pt in MRI d/t CT scanner being down.  Paged Dr Quinn Axe at [phone] and explained situation.   Dr Quinn Axe spoke with Radiologist at 1811 and discussed images.   Code stroke cancelled  at 94 by Dr Quinn Axe d/t no acute stroke seen on MRI.   Biagio Borg, Telestroke RN

## 2022-04-03 NOTE — ED Notes (Signed)
Pt arrived to room from MRI.
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
## 2022-04-03 NOTE — ED Provider Notes (Signed)
[facility] Provider Note   CSN: [id number] Arrival date & time: 04/03/22  1725  An emergency department physician performed an initial assessment on this suspected stroke patient at 1734.  History  Chief Complaint  Patient presents with   Numbness    Leonia ReaderMelvin T Masella is a 81 y.o. male.  HPI Patient presents for strokelike symptoms.  He has no known chronic medical conditions.  He also does not see doctors.  He denies any current use of medications.  He reports that he was in his normal state of health earlier today.  At approximately 3 PM, patient experienced right hemibody numbness.  This was most prominent in his right arm.  He also experienced a mild chest pain.  He continues to experience the symptoms on his arrival in the ED.  He also has noted some right eye ptosis and difficulty vision from right eye.    Home Medications Prior to Admission medications   Medication Sig Start Date End Date Taking? Authorizing Provider  predniSONE (STERAPRED UNI-PAK 21 TAB) 10 MG (21) TBPK tablet Take 1 tablet (10 mg total) by mouth daily. Take 6 tabs by mouth daily  for 2 days, then 5 tabs for 2 days, then 4 tabs for 2 days, then 3 tabs for 2 days, 2 tabs for 2 days, then 1 tab by mouth daily for 2 days 01/31/16   Jacalyn LefevreHaviland, Julie, MD      Allergies    Patient has no known allergies.    Review of Systems   Review of Systems  Eyes:  Positive for visual disturbance.  Cardiovascular:  Positive for chest pain.  Neurological:  Positive for weakness and numbness.  All other systems reviewed and are negative.   Physical Exam Updated Vital Signs BP 135/77   Pulse 68   Temp 97.9 F (36.6 C) (Oral)   Resp 11   Ht 5\' 6"  (1.676 m)   Wt 84.6 kg   SpO2 97%   BMI 30.09 kg/m  Physical Exam Vitals and nursing note reviewed.  Constitutional:      General: He is not in acute distress.    Appearance: Normal appearance. He is well-developed. He is not ill-appearing,  toxic-appearing or diaphoretic.  HENT:     Head: Normocephalic and atraumatic.     Right Ear: External ear normal.     Left Ear: External ear normal.     Nose: Nose normal.     Mouth/Throat:     Mouth: Mucous membranes are moist.     Pharynx: Oropharynx is clear.  Eyes:     Extraocular Movements: Extraocular movements intact.     Conjunctiva/sclera: Conjunctivae normal.  Cardiovascular:     Rate and Rhythm: Normal rate and regular rhythm.     Heart sounds: No murmur heard. Pulmonary:     Effort: Pulmonary effort is normal. No respiratory distress.     Breath sounds: Normal breath sounds. No wheezing or rales.  Abdominal:     General: There is no distension.     Palpations: Abdomen is soft.     Tenderness: There is no abdominal tenderness.  Musculoskeletal:        General: No swelling. Normal range of motion.     Cervical back: Normal range of motion and neck supple.     Right lower leg: No edema.     Left lower leg: No edema.  Skin:    General: Skin is warm and dry.     Coloration: Skin  is not jaundiced or pale.  Neurological:     Mental Status: He is alert and oriented to person, place, and time.     Cranial Nerves: Facial asymmetry present.     Sensory: Sensory deficit (Diminished sensation in right face, UE, LE) present.     Motor: No weakness, abnormal muscle tone or pronator drift.     Coordination: Coordination is intact.  Psychiatric:        Mood and Affect: Mood normal.        Behavior: Behavior normal.        Thought Content: Thought content normal.        Judgment: Judgment normal.     ED Results / Procedures / Treatments   Labs (all labs ordered are listed, but only abnormal results are displayed) Labs Reviewed  COMPREHENSIVE METABOLIC PANEL - Abnormal; Notable for the following components:      Result Value   Glucose, Bld 100 (*)    GFR, Estimated 59 (*)    All other components within normal limits  URINALYSIS, ROUTINE W REFLEX MICROSCOPIC - Abnormal;  Notable for the following components:   Color, Urine STRAW (*)    Specific Gravity, Urine 1.003 (*)    Hgb urine dipstick MODERATE (*)    Bacteria, UA RARE (*)    All other components within normal limits  ETHANOL  PROTIME-INR  APTT  CBC  DIFFERENTIAL  RAPID URINE DRUG SCREEN, HOSP PERFORMED  LIPID PANEL  HEMOGLOBIN A1C  CBC  COMPREHENSIVE METABOLIC PANEL  MAGNESIUM  I-STAT CHEM 8, ED  CBG MONITORING, ED  TROPONIN I (HIGH SENSITIVITY)    EKG EKG Interpretation  Date/Time:  Wednesday April 03 2022 17:38:26 EDT Ventricular Rate:  87 PR Interval:  174 QRS Duration: 70 QT Interval:  356 QTC Calculation: 428 R Axis:   -18 Text Interpretation: Sinus rhythm with occasional Premature ventricular complexes Confirmed by Gloris Manchester [phone]) on 04/03/2022 9:34:16 PM  Radiology MR ANGIO ABDOMEN WO CONTRAST  Result Date: 04/03/2022 CLINICAL DATA:  Acute aortic syndrome (AAS) suspected; Aortic aneurysm, known or suspected EXAM: MRA CHEST AND ABDOMEN WITHOUT CONTRAST TECHNIQUE: Multiplanar, multiecho pulse sequences of the chest and abdomen were obtained without intravenous contrast. Angiographic images of chest and abdomen were obtained using MRA technique without intravenous contrast. CONTRAST:  No additional contrast was administered for this examination (contrast was previously administered at 6:19 p.m. for MRA examination of the head and neck) COMPARISON:  CT 01/08/2015 FINDINGS: MRA CHEST FINDINGS Residual contrast from prior examination limits assessment for intramural hematoma, however, no intramural hematoma is identified. The thoracic aorta is dilated measuring 4.2 cm in diameter in its ascending segment, 3.6 cm in diameter in its proximal descending segment just beyond the arch, and 3.2 cm in diameter in its distal descending segment at the level of the left atrium. No dissection. Cardiac size within normal limits. No pericardial effusion. Central pulmonary arteries are of normal  caliber. No pathologic thoracic adenopathy. Moderate hiatal hernia. Anterior wedge compression deformity of T12 appears chronic in nature. MRA ABDOMEN FINDINGS The aorta is of normal caliber. No aneurysm or dissection. Evaluation of the visceral vessels is limited in absence of contrast administration. 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 No definite visceral vessel stenosis identified on this limited examination. Simple cyst identified within the left hepatic lobe. Additional T2 hyperintense lesions within the liver are not accurately characterized on this examination but likely represent additional cysts. Gallbladder, pancreas, spleen, adrenal  glands, are unremarkable. Multiple cystic lesions are identified within the kidneys bilaterally which are incompletely characterized in absence of contrast administration. No pathologic adenopathy. Visualized bowel is unremarkable. No bone abnormality. IMPRESSION: 1. 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 No dissection. Recommend annual imaging followup by CTA or MRA. This recommendation follows 2010 ACCF/AHA/AATS/ACR/ASA/SCA/SCAI/SIR/STS/SVM Guidelines for the Diagnosis and Management of Patients with Thoracic Aortic Disease. Circulation. 2010; 121: U542-H062. Aortic aneurysm NOS (ICD10-I71.9) 2. No aortic aneurysm or dissection. 3. Moderate hiatal hernia. 4. 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 5. Multiple cystic lesions within the kidneys bilaterally, incompletely characterized in absence of contrast administration. These would be better assessed with dedicated contrast enhanced CT or MRI examination. 6. 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 7. Multiple T2 hyperintense lesions within the liver are not accurately characterized on this examination but likely represent cysts. These, again, would be better assessed with dedicated imaging  once the patient's acute issues have resolved. Electronically Signed   By: Helyn Numbers M.D.   On: 04/03/2022 20:07   MR ANGIO CHEST WO CONTRAST  Result Date: 04/03/2022 CLINICAL DATA:  Acute aortic syndrome (AAS) suspected; Aortic aneurysm, known or suspected EXAM: MRA CHEST AND ABDOMEN WITHOUT CONTRAST TECHNIQUE: Multiplanar, multiecho pulse sequences of the chest and abdomen were obtained without intravenous contrast. Angiographic images of chest and abdomen were obtained using MRA technique without intravenous contrast. CONTRAST:  No additional contrast was administered for this examination (contrast was previously administered at 6:19 p.m. for MRA examination of the head and neck) COMPARISON:  CT 01/08/2015 FINDINGS: MRA CHEST FINDINGS Residual contrast from prior examination limits assessment for intramural hematoma, however, no intramural hematoma is identified. The thoracic aorta is dilated measuring 4.2 cm in diameter in its ascending segment, 3.6 cm in diameter in its proximal descending segment just beyond the arch, and 3.2 cm in diameter in its distal descending segment at the level of the left atrium. No dissection. Cardiac size within normal limits. No pericardial effusion. Central pulmonary arteries are of normal caliber. No pathologic thoracic adenopathy. Moderate hiatal hernia. Anterior wedge compression deformity of T12 appears chronic in nature. MRA ABDOMEN FINDINGS The aorta is of normal caliber. No aneurysm or dissection. Evaluation of the visceral vessels is limited in absence of contrast administration. 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 No definite visceral vessel stenosis identified on this limited examination. Simple cyst identified within the left hepatic lobe. Additional T2 hyperintense lesions within the liver are not accurately characterized on this examination but likely represent additional cysts. Gallbladder, pancreas, spleen, adrenal  glands, are unremarkable. Multiple cystic lesions are identified within the kidneys bilaterally which are incompletely characterized in absence of contrast administration. No pathologic adenopathy. Visualized bowel is unremarkable. No bone abnormality. IMPRESSION: 1. 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 No dissection. Recommend annual imaging followup by CTA or MRA. This recommendation follows 2010 ACCF/AHA/AATS/ACR/ASA/SCA/SCAI/SIR/STS/SVM Guidelines for the Diagnosis and Management of Patients with Thoracic Aortic Disease. Circulation. 2010; 121: B762-G315. Aortic aneurysm NOS (ICD10-I71.9) 2. No aortic aneurysm or dissection. 3. Moderate hiatal hernia. 4. 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 5. Multiple cystic lesions within the kidneys bilaterally, incompletely characterized in absence of contrast administration. These would be better assessed with dedicated contrast enhanced CT or MRI examination. 6. 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 7. Multiple T2 hyperintense lesions within the liver are not accurately characterized on this examination but likely represent cysts. These, again, would be better assessed with dedicated imaging once the patient's acute issues have resolved. Electronically Signed   By: Helyn Numbers M.D.   On: 04/03/2022 20:07   MR ANGIO NECK W WO CONTRAST  Result Date: 04/03/2022 CLINICAL DATA:  Carotid artery stenosis suspected; right-sided weakness EXAM: MRA NECK WITHOUT AND WITH CONTRAST TECHNIQUE: Multiplanar and multiecho pulse sequences of the neck were obtained without and with intravenous contrast. Angiographic images of the neck were obtained using MRA technique without and with intravenous contrast. CONTRAST:  16mL GADAVIST GADOBUTROL 1 MMOL/ML IV SOLN COMPARISON:  None Available. FINDINGS: Evaluation is somewhat limited by bolus timing, with much of the contrast appearing to  be in the venous system. Standard aortic branching. Imaged portion shows no evidence of aneurysm or dissection. No significant stenosis of the major arch vessel origins. Common, internal, and external carotid arteries are patent, without hemodynamically significant stenosis. Extracranial vertebral arteries are patent, without hemodynamically significant stenosis. IMPRESSION: No hemodynamically significant stenosis in the neck.  No Electronically Signed   By: Wiliam Ke M.D.   On: 04/03/2022 19:11   MR BRAIN WO CONTRAST  Result Date: 04/03/2022 CLINICAL DATA:  Right-sided symptoms, right eye blurry vision; stroke suspected EXAM: MRI HEAD WITHOUT CONTRAST MRA HEAD WITHOUT CONTRAST TECHNIQUE: Multiplanar, multi-echo pulse sequences of the brain and surrounding structures were acquired without intravenous contrast. Angiographic images of the Circle of Willis were acquired using MRA technique without intravenous contrast. COMPARISON:  No prior MRI, correlation is made with CT head 01/08/2015 FINDINGS: MRI HEAD FINDINGS Brain: No restricted diffusion to suggest acute or subacute infarct. No acute hemorrhage, mass, mass effect, or midline shift. No hydrocephalus or extra-axial collection. Cerebral volume is within normal limits for age. Vascular: Please see MRA findings below. Skull and upper cervical spine: Normal marrow signal. Sinuses/Orbits: Mucosal thickening in the ethmoid air cells. The orbits are unremarkable. Other: Fluid in the right mastoid air cells. MRA HEAD FINDINGS Anterior circulation: Both internal carotid arteries are patent to the termini, without significant stenosis. A1 segments patent, with multifocal stenosis and relatively poor signal in the left A1 (series 10, image 100), although some of this may be artifactual. Normal anterior communicating artery. Anterior cerebral arteries are patent to their distal aspects. No M1 stenosis or occlusion. Relatively poor signal in the more inferior and  anterior left M2 branch (series 10, image 100). Distal MCA branches perfused and symmetric. Posterior circulation: Vertebral arteries patent to the vertebrobasilar junction without stenosis. Basilar patent to its distal aspect. Superior cerebellar arteries patent bilaterally. Patent P1 segments. PCAs perfused to their distal aspects without stenosis. The bilateral posterior communicating arteries are not visualized. Anatomic variants: None significant IMPRESSION: 1. No acute intracranial process. No evidence of acute or subacute infarct. 2. No intracranial large vessel occlusion. 3. Relatively poor signal in the more inferior and anterior left M2 branch and in the left A1, which also demonstrates multifocal stenosis, which could be artifactual. Electronically Signed   By: Wiliam Ke M.D.   On: 04/03/2022 19:03   MR ANGIO HEAD WO CONTRAST  Result Date: 04/03/2022 CLINICAL DATA:  Right-sided symptoms, right eye blurry vision; stroke suspected EXAM: MRI HEAD WITHOUT CONTRAST MRA HEAD WITHOUT CONTRAST TECHNIQUE: Multiplanar, multi-echo pulse sequences of the brain and surrounding structures were acquired without intravenous contrast. Angiographic images of the Circle of Willis were acquired using MRA technique without  intravenous contrast. COMPARISON:  No prior MRI, correlation is made with CT head 01/08/2015 FINDINGS: MRI HEAD FINDINGS Brain: No restricted diffusion to suggest acute or subacute infarct. No acute hemorrhage, mass, mass effect, or midline shift. No hydrocephalus or extra-axial collection. Cerebral volume is within normal limits for age. Vascular: Please see MRA findings below. Skull and upper cervical spine: Normal marrow signal. Sinuses/Orbits: Mucosal thickening in the ethmoid air cells. The orbits are unremarkable. Other: Fluid in the right mastoid air cells. MRA HEAD FINDINGS Anterior circulation: Both internal carotid arteries are patent to the termini, without significant stenosis. A1  segments patent, with multifocal stenosis and relatively poor signal in the left A1 (series 10, image 100), although some of this may be artifactual. Normal anterior communicating artery. Anterior cerebral arteries are patent to their distal aspects. No M1 stenosis or occlusion. Relatively poor signal in the more inferior and anterior left M2 branch (series 10, image 100). Distal MCA branches perfused and symmetric. Posterior circulation: Vertebral arteries patent to the vertebrobasilar junction without stenosis. Basilar patent to its distal aspect. Superior cerebellar arteries patent bilaterally. Patent P1 segments. PCAs perfused to their distal aspects without stenosis. The bilateral posterior communicating arteries are not visualized. Anatomic variants: None significant IMPRESSION: 1. No acute intracranial process. No evidence of acute or subacute infarct. 2. No intracranial large vessel occlusion. 3. Relatively poor signal in the more inferior and anterior left M2 branch and in the left A1, which also demonstrates multifocal stenosis, which could be artifactual. Electronically Signed   By: Wiliam Ke M.D.   On: 04/03/2022 19:03    Procedures Procedures    Medications Ordered in ED Medications   stroke: early stages of recovery book (has no administration in time range)  acetaminophen (TYLENOL) tablet 650 mg (has no administration in time range)    Or  acetaminophen (TYLENOL) 160 MG/5ML solution 650 mg (has no administration in time range)    Or  acetaminophen (TYLENOL) suppository 650 mg (has no administration in time range)  senna-docusate (Senokot-S) tablet 1 tablet (has no administration in time range)  heparin injection 5,000 Units (has no administration in time range)  gadobutrol (GADAVIST) 1 MMOL/ML injection 8 mL (8 mLs Intravenous Contrast Given 04/03/22 1901)  lactated ringers bolus 1,000 mL (0 mLs Intravenous Stopped 04/03/22 2330)  meclizine (ANTIVERT) tablet 12.5 mg (12.5 mg Oral  Given 04/03/22 2057)    ED Course/ Medical Decision Making/ A&P                           Medical Decision Making Amount and/or Complexity of Data Reviewed Labs: ordered. Radiology: ordered.  Risk Prescription drug management. Decision regarding hospitalization.   This patient presents to the ED for concern of stroke-like symptoms, this involves an extensive number of treatment options, and is a complaint that carries with it a high risk of complications and morbidity.  The differential diagnosis includes CVA, TIA, hypoglycemia, complex migraine, seizure, aortic dissection   Co morbidities that complicate the patient evaluation  None known   Additional history obtained:  Additional history obtained from N/A External records from outside source obtained and reviewed including EMR   Lab Tests:  I Ordered, and personally interpreted labs.  The pertinent results include: No abnormal findings   Imaging Studies ordered:  I ordered imaging studies including MRI brain, MRA head and neck, MRA chest and abdomen I independently visualized and interpreted imaging which showed no evidence of acute stroke.  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  No acute findings on MRA of chest, abdomen.  There were chronic findings of fusiform dilation of a sending aorta and celiac artery. I agree with the radiologist interpretation   Cardiac Monitoring: / EKG:  The patient was maintained on a cardiac monitor.  I personally viewed and interpreted the cardiac monitored which showed an underlying rhythm of: Sinus rhythm    Problem List / ED Course / Critical interventions / Medication management  81 year old male presenting for cute onset of strokelike symptoms.  He describes onset as 3 PM.  It was sudden.  He describes nearly complete loss of sensation to his right arm.  On exam, he does have right ptosis.  There is forehead sparing.  He endorses diminished sensation to  the right side of his face.  He has slightly diminished strength in his right arm.  He has significant loss of sensation to right arm.  He also describes diminished sensation to right leg.  Patient has no history of CVA.  He is not on any blood thinning medications or antiplatelet medications.  He is not on any medications at all.  Per chart review, he was seen for bilateral ptosis 3 years ago.  Given symptoms and timeline, code stroke was initiated.  Currently, at this facility, CT imaging is not available.  Patient was taken directly to MRI for MRI imaging.  MRI did not show any evidence of CVA.  Code stroke was canceled.  Given absence of stroke and presence of chest pain and neurologic deficits, in addition to absence of CT capability at this time, patient underwent MRA of chest and abdomen to assess for possible aortic dissection.  MRI of brain did show some multifocal areas of stenosis in left-sided vasculature.  This does correlate with his symptoms.  MRA of chest and abdomen was negative for acute findings.  Laboratory work-up was also unremarkable.  On reassessment, patient endorses improvement in symptoms with continued diminished sensation in right arm.  When attending to ambulate, he does endorse some dizziness.  IV fluids and meclizine were ordered.  I spoke with neurologist on-call, Dr. Lorrin Goodell who feels that presentation is consistent with TIA.  He does recommend admission to medicine for further TIA work-up.  Patient was agreeable to this.  He was admitted to medicine for further management. I ordered medication including IV fluids for duration; meclizine for dizziness Reevaluation of the patient after these medicines showed that the patient improved I have reviewed the patients home medicines and have made adjustments as needed   Social Determinants of Health:  Does not see doctors         Final Clinical Impression(s) / ED Diagnoses Final diagnoses:  TIA (transient ischemic  attack)    Rx / DC Orders ED Discharge Orders     None         Godfrey Pick, MD 04/04/22 [phone]

## 2022-04-03 NOTE — ED Triage Notes (Signed)
X 1 hr, right arm, right face & right eye blurry vision. Also c/o CP & SOB

## 2022-04-04 ENCOUNTER — Other Ambulatory Visit (HOSPITAL_COMMUNITY): Payer: Self-pay | Admitting: *Deleted

## 2022-04-04 ENCOUNTER — Observation Stay (HOSPITAL_BASED_OUTPATIENT_CLINIC_OR_DEPARTMENT_OTHER): Payer: Medicare Other

## 2022-04-04 DIAGNOSIS — I639 Cerebral infarction, unspecified: Secondary | ICD-10-CM | POA: Diagnosis not present

## 2022-04-04 DIAGNOSIS — R0789 Other chest pain: Secondary | ICD-10-CM | POA: Diagnosis not present

## 2022-04-04 DIAGNOSIS — E785 Hyperlipidemia, unspecified: Secondary | ICD-10-CM | POA: Diagnosis not present

## 2022-04-04 DIAGNOSIS — F17200 Nicotine dependence, unspecified, uncomplicated: Secondary | ICD-10-CM | POA: Diagnosis not present

## 2022-04-04 DIAGNOSIS — G459 Transient cerebral ischemic attack, unspecified: Secondary | ICD-10-CM | POA: Diagnosis not present

## 2022-04-04 DIAGNOSIS — R079 Chest pain, unspecified: Secondary | ICD-10-CM

## 2022-04-04 LAB — LIPID PANEL
Cholesterol: 131 mg/dL (ref 0–200)
HDL: 43 mg/dL (ref >40)
LDL Cholesterol: 78 mg/dL (ref 0–99)
Total CHOL/HDL Ratio: 3 RATIO
Triglycerides: 49 mg/dL (ref <150)
VLDL: 10 mg/dL (ref 0–40)

## 2022-04-04 LAB — TROPONIN I (HIGH SENSITIVITY): Troponin I (High Sensitivity): 6 ng/L (ref <18)

## 2022-04-04 LAB — COMPREHENSIVE METABOLIC PANEL
ALT: 13 U/L (ref 0–44)
AST: 16 U/L (ref 15–41)
Albumin: 3.5 g/dL (ref 3.5–5.0)
Alkaline Phosphatase: 51 U/L (ref 38–126)
Anion gap: 4 — ABNORMAL LOW (ref 5–15)
BUN: 15 mg/dL (ref 8–23)
CO2: 27 mmol/L (ref 22–32)
Calcium: 8.7 mg/dL — ABNORMAL LOW (ref 8.9–10.3)
Chloride: 109 mmol/L (ref 98–111)
Creatinine, Ser: 1.22 mg/dL (ref 0.61–1.24)
GFR, Estimated: 59 mL/min — ABNORMAL LOW (ref >60)
Glucose, Bld: 94 mg/dL (ref 70–99)
Potassium: 3.7 mmol/L (ref 3.5–5.1)
Sodium: 140 mmol/L (ref 135–145)
Total Bilirubin: 1 mg/dL (ref 0.3–1.2)
Total Protein: 6.2 g/dL — ABNORMAL LOW (ref 6.5–8.1)

## 2022-04-04 LAB — CBC
HCT: 38.3 % — ABNORMAL LOW (ref 39.0–52.0)
Hemoglobin: 13 g/dL (ref 13.0–17.0)
MCH: 30.4 pg (ref 26.0–34.0)
MCHC: 33.9 g/dL (ref 30.0–36.0)
MCV: 89.7 fL (ref 80.0–100.0)
Platelets: 143 10*3/uL — ABNORMAL LOW (ref 150–400)
RBC: 4.27 MIL/uL (ref 4.22–5.81)
RDW: 14.6 % (ref 11.5–15.5)
WBC: 5.5 10*3/uL (ref 4.0–10.5)
nRBC: 0 % (ref 0.0–0.2)

## 2022-04-04 LAB — ECHOCARDIOGRAM COMPLETE
Area-P 1/2: 4.06 cm2
Height: 66 in
P 1/2 time: 459 msec
S' Lateral: 3.1 cm
Weight: 2982.4 oz

## 2022-04-04 LAB — VITAMIN B12: Vitamin B-12: 153 pg/mL — ABNORMAL LOW (ref 180–914)

## 2022-04-04 LAB — TSH: TSH: 3.567 u[IU]/mL (ref 0.350–4.500)

## 2022-04-04 LAB — HEMOGLOBIN A1C
Hgb A1c MFr Bld: 5.1 % (ref 4.8–5.6)
Mean Plasma Glucose: 99.67 mg/dL

## 2022-04-04 LAB — MAGNESIUM: Magnesium: 2.1 mg/dL (ref 1.7–2.4)

## 2022-04-04 MED ORDER — CLOPIDOGREL BISULFATE 75 MG PO TABS
75.0000 mg | ORAL_TABLET | Freq: Every day | ORAL | Status: DC
  Administered 2022-04-04 – 2022-04-05 (×2): 75 mg via ORAL
  Filled 2022-04-04 (×2): qty 1

## 2022-04-04 MED ORDER — ASPIRIN 81 MG PO CHEW
81.0000 mg | CHEWABLE_TABLET | Freq: Every day | ORAL | Status: DC
  Administered 2022-04-04 – 2022-04-05 (×2): 81 mg via ORAL
  Filled 2022-04-04 (×2): qty 1

## 2022-04-04 MED ORDER — ATORVASTATIN CALCIUM 40 MG PO TABS
40.0000 mg | ORAL_TABLET | Freq: Every day | ORAL | Status: DC
  Administered 2022-04-04: 40 mg via ORAL
  Filled 2022-04-04: qty 1

## 2022-04-04 NOTE — Assessment & Plan Note (Addendum)
-   Right-sided weakness and numbness reported. - MRA head without contrast, MRI neck without contrast, MRI brain without contrast shows no acute intracranial process.  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 -- Neurology service consulted and recommendations provided for 21 days of dual antiplatelet therapy with subsequent use of aspirin on daily basis for secondary prevention. -Patient has also been started on statin. -Discharge still reporting intermittent episodes of diplopia; outpatient follow-up with ophthalmology service recommended. -If no abnormalities found by ophthalmology patient will benefit of outpatient myasthenia work-up during his outpatient neurology follow-up.

## 2022-04-04 NOTE — Assessment & Plan Note (Addendum)
-   Unpredictable, not associated with dyspnea, exertion or nausea. - Negative troponin and no acute ischemic changes appreciated on EKG -2D echo reassuring -Patient has been started on PPI.

## 2022-04-04 NOTE — Consult Note (Addendum)
I connected with  Cameron Alexander on 04/04/22 by a video enabled telemedicine application and verified that I am speaking with the correct person using two identifiers.   I discussed the limitations of evaluation and management by telemedicine. The patient expressed understanding and agreed to proceed.  Location of patient: [facility] Location of physician: [facility]  Neurology Consultation Reason for Consult: Stroke Referring Physician: Dr Reed Pandy  CC: Right face arm numbness and blurry vision  History is obtained from: Patient, wife at bedside, chart review  HPI: Cameron Alexander is a 81 y.o. male with no significant past medical history but does not see a physician who presented with sudden onset of right eye blurry vision, droopiness, right-sided numbness.  States he was sitting watching TV when his symptoms started around 3:00 yesterday afternoon.  The numbness has since gradually improved but not completely back to baseline.  Blurry vision has also improved but not back to baseline.  Continues to have right eye ptosis.  States he had similar symptoms about 3 weeks ago and a couple of months ago which completely resolved.  Also reports at times feeling palpitations.  Additionally, patient reports feeling lightheaded if he suddenly gets up. Denies any headache, jerking.  Denies any alleviation in the right-sided ptosis.  Denies any muscle weakness, getting tired after prolonged use.  Patient reported he has noticed that while driving, when he is looking straight he feels okay.  However if he looks to the left and then to the right then he continues to see the image from the left side and his vision superimposed on the image on the right side.  Patient and wife also reported that he has increased urination predominantly at night, urgency and trouble urinating.  Last known normal: 04/03/2022 1500 Event happened at home No acute events overnight No thrombectomy as  no large vessel occlusion mRS 0  ROS: All other systems reviewed and negative except as noted in the HPI.   No significant past medical history as patient does not see physicians  Patient's daughter has a history of seizures since birth, unclear etiology.  Social History:  reports that he has never smoked. He uses smokeless tobacco. He reports current alcohol use of about 2.0 standard drinks of alcohol per week. He reports that he does not use drugs.   Exam: Current vital signs: BP (!) 172/88   Pulse 63   Temp 98.4 F (36.9 C) (Oral)   Resp 10   Ht 5\' 6"  (1.676 m)   Wt 84.6 kg   SpO2 98%   BMI 30.09 kg/m  Vital signs in last 24 hours: Temp:  [97.9 F (36.6 C)-98.4 F (36.9 C)] 98.4 F (36.9 C) (10/12 0750) Pulse Rate:  [63-91] 63 (10/12 0750) Resp:  [10-20] 10 (10/12 0750) BP: (135-172)/(77-108) 172/88 (10/12 0750) SpO2:  [97 %-99 %] 98 % (10/12 0750) Weight:  [84.6 kg] 84.6 kg (10/11 1738)   Physical Exam  Constitutional: Appears well-developed and well-nourished.  Psych: Affect appropriate to situation Neuro: AAO x3, right eye ptosis, pupil appears equal and reacting to light, EOMI, no facial asymmetry, rest of the cranial nerves II to XII appear grossly intact, antigravity strength in all 4 extremities without drift, decreased sensation to light touch in right upper extremity, FTN intact bilaterally but took him longer to perform FTN with his right arm and initially went close to mouth but self corrected to nose  NIHSS 1  I have reviewed labs  in epic and the results pertinent to this consultation are: CBC:  Recent Labs  Lab 04/03/22 1744 04/03/22 1751 04/04/22 0338  WBC 7.7  --  5.5  NEUTROABS 5.4  --   --   HGB 14.7 14.6 13.0  HCT 43.0 43.0 38.3*  MCV 87.2  --  89.7  PLT 179  --  143*    Basic Metabolic Panel:  Lab Results  Component Value Date   NA 140 04/04/2022   K 3.7 04/04/2022   CO2 27 04/04/2022   GLUCOSE 94 04/04/2022   BUN 15 04/04/2022    CREATININE 1.22 04/04/2022   CALCIUM 8.7 (L) 04/04/2022   GFRNONAA 59 (L) 04/04/2022   GFRAA >60 01/31/2016   Lipid Panel:  Lab Results  Component Value Date   LDLCALC 78 04/04/2022   HgbA1c:  Lab Results  Component Value Date   HGBA1C 5.1 04/03/2022   Urine Drug Screen:     Component Value Date/Time   LABOPIA NONE DETECTED 04/03/2022 1840   COCAINSCRNUR NONE DETECTED 04/03/2022 1840   LABBENZ NONE DETECTED 04/03/2022 1840   AMPHETMU NONE DETECTED 04/03/2022 1840   THCU NONE DETECTED 04/03/2022 1840   LABBARB NONE DETECTED 04/03/2022 1840    Alcohol Level     Component Value Date/Time   ETH <10 04/03/2022 1744     I have reviewed the images obtained: MRI brain and MRA head without contrast 04/03/2022: No acute intracranial process. No evidence of acute or subacute infarct. No intracranial large vessel occlusion. Relatively poor signal in the more inferior and anterior left M2 branch and in the left A1, which also demonstrates multifocal stenosis, which could be artifactual.  MRA neck with and without contrast 04/03/2022:  No hemodynamically significant stenosis in the neck.    MRA chest and abdomen without contrast 04/03/2022: 1. 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 No dissection. Recommend annual imaging followup by CTA or MRA.  2. No aortic aneurysm or dissection. 3. Moderate hiatal hernia. 4. 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 5. Multiple cystic lesions within the kidneys bilaterally,incompletely characterized in absence of contrast administration.These would be better assessed with dedicated contrast enhanced CT or MRI examination. 6. 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 7. Multiple T2 hyperintense lesions within the liver are not accurately characterized on this examination but likely represent cysts. These, again, would be better assessed with dedicated  imaging once the patient's acute issues have resolved.   ASSESSMENT/PLAN: 81 year old male who presented with sudden onset right eye blurry vision, ptosis, right-sided numbness and dizziness most of which has since resolved/improved but continues to have persistent ptosis.  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  If negative for thrombus and PFO, recommend 30-day event monitor at the time of discharge (patient also reports palpitations) -Goal blood pressure: Permissive hypertension <220/110 till around 1500 today  followed by normotension -Stroke education including be passed - Modification of stroke risk factors -Right eye ptosis does not appear to have any fluctuations and he does not report other symptoms concerning for myasthenia gravis other than intermittent double vision mostly when  driving.  I would recommend follow-up with ophthalmology ASAP.  If no primary ophthalmologic issues, recommend follow-up with neurology in 2 to 3 months for stroke and possible myasthenia work-up -Discussed plan with patient, wife at bedside and Dr. Gwenlyn Perking  Suspected BPH -Patient reports increased used urinary frequency and urgency -Recommend further work-up with primary care physician  Diplopia -Recommend outpatient ophthalmology follow-up ASAP   Thank you for allowing [place] to participate in the care of this patient. If you have any further questions, please contact  me or neurohospitalist.   Lindie Spruce [facility] neurohospitalist

## 2022-04-04 NOTE — Progress Notes (Signed)
Patient seen and examined; admitted after midnight secondary to strokelike symptoms.  Patient reports right-sided face and arm numbness/weakness with associated right-sided blurred vision.  Patient reports symptoms present for approximately 2 hours before complete resolution.  At time of presentation to the emergency department he was out of window for tPA or any further intervention.  Hemodynamically stable currently.  MRI/MRA head and neck demonstrating no acute intracranial abnormalities.  Neurology consulted with recommendation to complete a stroke work-up.  This refer to H&P written by Dr.Zierle-Ghosh for further info/details on admission.  Plan: -Obtain TTE, TSH, LDL, B12 and A1c -Allow for permissive hypertension in the next 24 hours prior to proceed with adequate risk factor modifications and good blood pressure control. -Per neurology recommendation aspirin and Plavix will be given for 21 days, and followed by the use of aspirin on daily basis. -Follow recommendations by PT/OT and speech therapy.  Barton Dubois MD [phone]

## 2022-04-04 NOTE — Progress Notes (Signed)
  Echocardiogram 2D Echocardiogram has been performed.  Cameron Alexander 04/04/2022, 9:08 AM

## 2022-04-04 NOTE — ED Notes (Signed)
Recliner moved into patient's room for wife, who has been sitting with patient all night.

## 2022-04-04 NOTE — H&P (Signed)
History and Physical    Patient: Cameron Alexander:[id number] DOB: 03/29/1941 DOA: 04/03/2022 DOS: the patient was seen and examined on 04/04/2022 PCP: Patient, No Pcp Per  Patient coming from: Home  Chief Complaint:  Chief Complaint  Patient presents with   Numbness   HPI: Cameron Alexander is a 81 y.o. male with medical history significant of no known medical history as patient does not like to go to healthcare providers, presents the ED with a chief complaint of right-sided weakness, headache, nausea.  Patient reports that for several weeks he has had pain in his eye.  It shoots from his mouth to his eye to his temple.  He reports the pain is only there for seconds.  He does have blurry vision from time to time but reports its not associated with the pain.  Patient reports that he has had difficulty driving as well.  He has been crusting yellow line when he cannot see.  Patient reports that everything goes blurry like double vision when he is trying to see.  That only last for seconds as well.  Patient reports that he has had arm pain and right upper extremity weakness as well.  Patient reports he has had a chest pain that is been intermittent.  He has noticed no provocative factors.  He thinks may be positional changes make it come on.  When positional changes make it go away.  Patient describes his pain as sharp and shooting but deep and aching when it is not.  Patient reports that he has had palpitations that last for few seconds.  They spontaneously resolved as well.  He reports double vision.  Patient has no other complaints at this time.  Patient does not smoke, does not drink.  He does use chewing tobacco.  He is vaccinated for COVID.  Patient is full code.  Review of Systems: As mentioned in the history of present illness. All other systems reviewed and are negative. History reviewed. No pertinent past medical history. Past Surgical History:  Procedure Laterality Date    ESOPHAGOGASTRODUODENOSCOPY ENDOSCOPY     Social History:  reports that he has never smoked. He uses smokeless tobacco. He reports current alcohol use of about 2.0 standard drinks of alcohol per week. He reports that he does not use drugs.  No Known Allergies  History reviewed. No pertinent family history.  Prior to Admission medications   Medication Sig Start Date End Date Taking? Authorizing Provider  predniSONE (STERAPRED UNI-PAK 21 TAB) 10 MG (21) TBPK tablet Take 1 tablet (10 mg total) by mouth daily. Take 6 tabs by mouth daily  for 2 days, then 5 tabs for 2 days, then 4 tabs for 2 days, then 3 tabs for 2 days, 2 tabs for 2 days, then 1 tab by mouth daily for 2 days 01/31/16   Jacalyn Lefevre, MD    Physical Exam: Vitals:   04/03/22 2000 04/03/22 2030 04/04/22 0100 04/04/22 0330  BP: (!) 146/89 135/77 (!) 172/91 (!) 158/79  Pulse: 71 68 65 64  Resp: 15 11 18 18   Temp:      TempSrc:      SpO2: 97% 97% 98% 98%  Weight:      Height:       1.  General: Patient lying supine in bed,  no acute distress   2. Psychiatric: Alert and oriented x 3, mood and behavior normal for situation, pleasant and cooperative with exam   3. Neurologic: Speech and language are  normal, face is symmetric, moves all 4 extremities voluntarily, at baseline without acute deficits on limited exam   4. HEENMT:  Head is atraumatic, normocephalic, pupils reactive to light, neck is supple, trachea is midline, mucous membranes are moist   5. Respiratory : Lungs are clear to auscultation bilaterally without wheezing, rhonchi, rales, no cyanosis, no increase in work of breathing or accessory muscle use   6. Cardiovascular : Heart rate normal, rhythm is regular, no murmurs, rubs or gallops, no peripheral edema, peripheral pulses palpated   7. Gastrointestinal:  Abdomen is soft, nondistended, nontender to palpation bowel sounds active, no masses or organomegaly palpated   8. Skin:  Skin is warm, dry and intact  without rashes, acute lesions, or ulcers on limited exam   9.Musculoskeletal:  No acute deformities or trauma, no asymmetry in tone, no peripheral edema, peripheral pulses palpated, no tenderness to palpation in the extremities  Data Reviewed: In the ED 7.9, heart rate 68-91, respiratory rate 11-20, blood pressure 135/77-153/108, satting 97% No leukocytosis with a white blood cell count of 7.7, hemoglobin 14.7 Chemistry shows elevated creatinine 1.23-likely patient's baseline UA is not indicative of UTI Several MRIs were reviewed - Neuro hospitalist consulted and recommends admitting patient for TIA work-up Admission requested for TIA work-up  Assessment and Plan: * TIA (transient ischemic attack) - Right-sided weakness and pain especially in the right upper extremity - MRA head without contrast, MRI neck without contrast, MRI brain without contrast shows no acute intracranial process.  Relatively poor signal in the inferior and anterior left M2 branch and left A1 which shows multifocal stenosis - Neuro hospitalist was consulted and recommended admission for TIA work-up - Patient has almost completely back to his baseline at this point - Since patient already had MRAs of head and neck, holding off on ultrasound carotids - Echo in the a.m. - Continue to monitor  Chest pain - Unpredictable, not associated with dyspnea, exertion or nausea - Initial troponin 5, repeat troponin with morning labs - EKG is without acute ischemic - Continue monitor  Tobacco use disorder - Patient uses chewing tobacco - Declines nicotine patch - Continue to monitor      Advance Care Planning:   Code Status: Full Code   Consults: None  Family Communication: Wife at bedside  Severity of Illness: The appropriate patient status for this patient is OBSERVATION. Observation status is judged to be reasonable and necessary in order to provide the required intensity of service to ensure the patient's  safety. The patient's presenting symptoms, physical exam findings, and initial radiographic and laboratory data in the context of their medical condition is felt to place them at decreased risk for further clinical deterioration. Furthermore, it is anticipated that the patient will be medically stable for discharge from the hospital within 2 midnights of admission.   Author: Rolla Plate, DO 04/04/2022 5:12 AM  For on call review [email or web address].

## 2022-04-04 NOTE — ED Notes (Signed)
Breakfast tray given to pt 

## 2022-04-04 NOTE — TOC Progression Note (Signed)
  Transition of Care [facility]) Screening Note   Patient Details  Name: Cameron Alexander Date of Birth: 1940-12-16   [facility] of [facility]) CM/SW Contact:    Boneta Lucks, RN Phone Number: 04/04/2022, 1:13 PM  No PT follow up   Transition of Care Department [facility]) has reviewed patient and no TOC needs have been identified at this time. We will continue to monitor patient advancement through interdisciplinary progression rounds. If new patient transition needs arise, please place a TOC consult.     Expected Discharge Plan: Home/Self Care Barriers to Discharge: Continued Medical Work up  Expected Discharge Plan and Services Expected Discharge Plan: Home/Self Care

## 2022-04-04 NOTE — Progress Notes (Signed)
SLP Cancellation Note  Patient Details Name: Cameron Alexander MRN: [id number] DOB: [birth year]   Cancelled treatment:       Reason Eval/Treat Not Completed: SLP screened, no needs identified, will sign off; SLP screened Pt in room. Pt denies any changes in swallowing, speech, language, or cognition. MRI negative for acute changes. SLE will be deferred at this time. Reconsult if indicated. SLP will sign off.   Thank you,  Genene Churn, [place]    [place] 04/04/2022, 4:03 PM

## 2022-04-04 NOTE — Evaluation (Signed)
Physical Therapy Evaluation Patient Details Name: Cameron Alexander MRN: [id number] DOB: 07-28-1940 Today's Date: 04/04/2022  History of Present Illness  Cameron Alexander is a 81 y.o. male with medical history significant of no known medical history as patient does not like to go to healthcare providers, presents the ED with a chief complaint of right-sided weakness, headache, nausea.  Patient reports that for several weeks he has had pain in his eye.  It shoots from his mouth to his eye to his temple.  He reports the pain is only there for seconds.  He does have blurry vision from time to time but reports its not associated with the pain.  Patient reports that he has had difficulty driving as well.  He has been crusting yellow line when he cannot see.  Patient reports that everything goes blurry like double vision when he is trying to see.  That only last for seconds as well.  Patient reports that he has had arm pain and right upper extremity weakness as well.  Patient reports he has had a chest pain that is been intermittent.  He has noticed no provocative factors.  He thinks may be positional changes make it come on.  When positional changes make it go away.  Patient describes his pain as sharp and shooting but deep and aching when it is not.  Patient reports that he has had palpitations that last for few seconds.  They spontaneously resolved as well.  He reports double vision.  Patient has no other complaints at this time.   Clinical Impression  Patient functioning near baseline for functional mobility and gait. Patient demonstrates good return for ambulation in hallway with no AD/loss of balance. Plan: patient discharged from physical therapy to care of nursing for ambulation daily as tolerated for length of stay.      Recommendations for follow up therapy are one component of a multi-disciplinary discharge planning process, led by the attending physician.  Recommendations may be updated based on  patient status, additional functional criteria and insurance authorization.  Follow Up Recommendations No PT follow up      Assistance Recommended at Discharge PRN  Patient can return home with the following  Assistance with cooking/housework    Equipment Recommendations None recommended by PT  Recommendations for Other Services       Functional Status Assessment Patient has not had a recent decline in their functional status     Precautions / Restrictions Precautions Precautions: Fall Restrictions Weight Bearing Restrictions: No      Mobility  Bed Mobility Overal bed mobility: Independent                  Transfers Overall transfer level: Independent Equipment used: None                    Ambulation/Gait Ambulation/Gait assistance: Modified independent (Device/Increase time) Gait Distance (Feet): 100 Feet Assistive device: None Gait Pattern/deviations: Decreased stride length, Decreased step length - left, Decreased step length - right Gait velocity: decreased     General Gait Details: Patient ambulated in hallway without assist/AD with no loss of balance  Stairs            Wheelchair Mobility    Modified Rankin (Stroke Patients Only)       Balance Overall balance assessment: Mild deficits observed, not formally tested  Pertinent Vitals/Pain Pain Assessment Pain Assessment: No/denies pain    Home Living Family/patient expects to be discharged to:: Private residence Living Arrangements: Spouse/significant other Available Help at Discharge: Family;Available 24 hours/day Type of Home: House       Alternate Level Stairs-Number of Steps: 6 steps up level and 6 down level to basement. (L rail to upper level; R rail down to lower level) Home Layout: Multi-level Home Equipment: None      Prior Function Prior Level of Function : Independent/Modified Independent              Mobility Comments: community ambulator w/o AD, drives ADLs Comments: Independent     Hand Dominance   Dominant Hand: Right    Extremity/Trunk Assessment   Upper Extremity Assessment Upper Extremity Assessment: Defer to OT evaluation    Lower Extremity Assessment Lower Extremity Assessment: Overall WFL for tasks assessed    Cervical / Trunk Assessment Cervical / Trunk Assessment: Normal  Communication   Communication: No difficulties  Cognition Arousal/Alertness: Awake/alert Behavior During Therapy: WFL for tasks assessed/performed Overall Cognitive Status: Within Functional Limits for tasks assessed                                          General Comments      Exercises     Assessment/Plan    PT Assessment Patient does not need any further PT services  PT Problem List         PT Treatment Interventions      PT Goals (Current goals can be found in the Care Plan section)  Acute Rehab PT Goals Patient Stated Goal: return home with family PT Goal Formulation: With patient/family Time For Goal Achievement: 04/08/22 Potential to Achieve Goals: Good    Frequency       Co-evaluation PT/OT/SLP Co-Evaluation/Treatment: Yes Reason for Co-Treatment: To address functional/ADL transfers PT goals addressed during session: Balance;Proper use of DME;Mobility/safety with mobility         AM-PAC PT "6 Clicks" Mobility  Outcome Measure Help needed turning from your back to your side while in a flat bed without using bedrails?: None Help needed moving from lying on your back to sitting on the side of a flat bed without using bedrails?: None Help needed moving to and from a bed to a chair (including a wheelchair)?: None Help needed standing up from a chair using your arms (e.g., wheelchair or bedside chair)?: None Help needed to walk in hospital room?: None Help needed climbing 3-5 steps with a railing? : A Little 6 Click Score: 23     End of Session   Activity Tolerance: Patient tolerated treatment well Patient left: in bed Nurse Communication: Mobility status PT Visit Diagnosis: Unsteadiness on feet (R26.81);Other abnormalities of gait and mobility (R26.89);Muscle weakness (generalized) (M62.81)    Time: 4332-9518 PT Time Calculation (min) (ACUTE ONLY): 20 min   Charges:   PT Evaluation $PT Eval Moderate Complexity: 1 Mod PT Treatments $Therapeutic Activity: 8-22 mins       Alan Mulder, SPT

## 2022-04-04 NOTE — ED Notes (Signed)
This RN assumed care for this pt, no needs expressed @ this time.

## 2022-04-04 NOTE — Assessment & Plan Note (Addendum)
-   Patient uses chewing tobacco - Cessation counseling provided.

## 2022-04-04 NOTE — Evaluation (Signed)
Occupational Therapy Evaluation Patient Details Name: Cameron Alexander MRN: [id number] DOB: [birth year] Today's Date: 04/04/2022   History of Present Illness Cameron Alexander is a 81 y.o. male with medical history significant of no known medical history as patient does not like to go to healthcare providers, presents the ED with a chief complaint of right-sided weakness, headache, nausea.  Patient reports that for several weeks he has had pain in his eye.  It shoots from his mouth to his eye to his temple.  He reports the pain is only there for seconds.  He does have blurry vision from time to time but reports its not associated with the pain.  Patient reports that he has had difficulty driving as well.  He has been crusting yellow line when he cannot see.  Patient reports that everything goes blurry like double vision when he is trying to see.  That only last for seconds as well.  Patient reports that he has had arm pain and right upper extremity weakness as well.  Patient reports he has had a chest pain that is been intermittent.  He has noticed no provocative factors.  He thinks may be positional changes make it come on.  When positional changes make it go away.  Patient describes his pain as sharp and shooting but deep and aching when it is not.  Patient reports that he has had palpitations that last for few seconds.  They spontaneously resolved as well.  He reports double vision.  Patient has no other complaints at this time.   Clinical Impression   Pt appears to be at or near baseline levels for functional mobility. Pt tolerated co-evaluation from PT and OT well. Able to doff and don sock seated at EOB and ambulate in room and hall without assist. Pt reports recent history of blurring/double vision while driving. Recommend pt see his optometrist. Pt is not recommended for further acute OT services and will be discharged to care of nursing staff for remaining length of stay.       Recommendations for  follow up therapy are one component of a multi-disciplinary discharge planning process, led by the attending physician.  Recommendations may be updated based on patient status, additional functional criteria and insurance authorization.   Follow Up Recommendations  No OT follow up    Assistance Recommended at Discharge None        Functional Status Assessment  Patient has not had a recent decline in their functional status  Equipment Recommendations  None recommended by OT    Recommendations for Other Services  See optometrist for vision evaluation.     Precautions / Restrictions Precautions Precautions: Fall Restrictions Weight Bearing Restrictions: No      Mobility Bed Mobility Overal bed mobility: Independent                  Transfers Overall transfer level: Independent Equipment used: None               General transfer comment: Able to ambulate in hall from bed and return to bed without assist.      Balance Overall balance assessment: Mild deficits observed, not formally tested                                         ADL either performed or assessed with clinical judgement   ADL Overall ADL's : Independent  Vision Baseline Vision/History: 1 Wears glasses Ability to See in Adequate Light: 2 Moderately impaired Patient Visual Report: Other (comment) (No change at time of evaluation but has been having periods of blurry vision/double vision where the white lines on the road blur together.) Vision Assessment?: No apparent visual deficits Additional Comments: Recommend pt see optometrist. Pt believes his perscription is wrong.       Hand Dominance Right   Extremity/Trunk Assessment Upper Extremity Assessment Upper Extremity Assessment: Overall WFL for tasks assessed   Lower Extremity Assessment Lower Extremity Assessment: Defer to PT evaluation   Cervical /  Trunk Assessment Cervical / Trunk Assessment: Normal   Communication Communication Communication: No difficulties   Cognition Arousal/Alertness: Awake/alert Behavior During Therapy: WFL for tasks assessed/performed Overall Cognitive Status: Within Functional Limits for tasks assessed                                                        Home Living Family/patient expects to be discharged to:: Private residence Living Arrangements: Spouse/significant other Available Help at Discharge: Family;Available 24 hours/day Type of Home: House       Home Layout: Multi-level Alternate Level Stairs-Number of Steps: 6 steps up level and 6 down level to basement. (L rail to upper level; R rail down to lower level) Alternate Level Stairs-Rails: Right;Left Bathroom Shower/Tub: Chief Strategy Officer: Standard Bathroom Accessibility: Yes How Accessible: Accessible via walker Home Equipment: None          Prior Functioning/Environment Prior Level of Function : Independent/Modified Independent             Mobility Comments: Community ambulator no AD ADLs Comments: Indepednent; drives but has been having difficulty.                                Co-evaluation PT/OT/SLP Co-Evaluation/Treatment: Yes Reason for Co-Treatment: To address functional/ADL transfers   OT goals addressed during session: ADL's and self-care      AM-PAC OT "6 Clicks" Daily Activity     Outcome Measure Help from another person eating meals?: None Help from another person taking care of personal grooming?: None Help from another person toileting, which includes using toliet, bedpan, or urinal?: None Help from another person bathing (including washing, rinsing, drying)?: None Help from another person to put on and taking off regular upper body clothing?: None Help from another person to put on and taking off regular lower body clothing?: None 6 Click Score: 24    End of Session    Activity Tolerance: Patient tolerated treatment well Patient left: in bed;with call bell/phone within reach;with family/visitor present  OT Visit Diagnosis: Other symptoms and signs involving the nervous system (E93.810)                Time: 1751-0258 OT Time Calculation (min): 14 min Charges:  OT General Charges $OT Visit: 1 Visit OT Evaluation $OT Eval Low Complexity: 1 Low  Kalman Nylen OT, MOT  Danie Chandler 04/04/2022, 9:28 AM

## 2022-04-05 DIAGNOSIS — K219 Gastro-esophageal reflux disease without esophagitis: Secondary | ICD-10-CM | POA: Diagnosis not present

## 2022-04-05 DIAGNOSIS — E782 Mixed hyperlipidemia: Secondary | ICD-10-CM | POA: Diagnosis not present

## 2022-04-05 DIAGNOSIS — H532 Diplopia: Secondary | ICD-10-CM

## 2022-04-05 DIAGNOSIS — G459 Transient cerebral ischemic attack, unspecified: Secondary | ICD-10-CM | POA: Diagnosis not present

## 2022-04-05 MED ORDER — ATORVASTATIN CALCIUM 40 MG PO TABS: 40.0000 mg | ORAL_TABLET | Freq: Every day | ORAL | 2 refills | Status: DC

## 2022-04-05 MED ORDER — PANTOPRAZOLE SODIUM 40 MG PO TBEC: 40.0000 mg | DELAYED_RELEASE_TABLET | Freq: Every day | ORAL | 1 refills | Status: DC

## 2022-04-05 MED ORDER — ASPIRIN 81 MG PO CHEW: 81.0000 mg | CHEWABLE_TABLET | Freq: Every day | ORAL | 4 refills | Status: AC

## 2022-04-05 MED ORDER — CLOPIDOGREL BISULFATE 75 MG PO TABS: 75.0000 mg | ORAL_TABLET | Freq: Every day | ORAL | 0 refills | Status: AC

## 2022-04-05 NOTE — Progress Notes (Signed)
Patient admitted last night, rested quietly throughout night with spouse at bedside. Ambulatory with steady gait no c/o pain or s/s of discomfort throughout the night, able to answer admission questions appropriately. Currently watching TV with call bell in reach.

## 2022-04-05 NOTE — Plan of Care (Signed)
  Problem: Education: Goal: Knowledge of General Education information will improve Description: Including pain rating scale, medication(s)/side effects and non-pharmacologic comfort measures 04/05/2022 1542 by Santa Lighter, RN Outcome: Adequate for Discharge 04/05/2022 1212 by Santa Lighter, RN Outcome: Progressing 04/05/2022 0932 by Santa Lighter, RN Outcome: Progressing   Problem: Health Behavior/Discharge Planning: Goal: Ability to manage health-related needs will improve 04/05/2022 1542 by Santa Lighter, RN Outcome: Adequate for Discharge 04/05/2022 0932 by Santa Lighter, RN Outcome: Progressing   Problem: Clinical Measurements: Goal: Ability to maintain clinical measurements within normal limits will improve Outcome: Adequate for Discharge Goal: Will remain free from infection Outcome: Adequate for Discharge Goal: Diagnostic test results will improve Outcome: Adequate for Discharge Goal: Respiratory complications will improve Outcome: Adequate for Discharge Goal: Cardiovascular complication will be avoided Outcome: Adequate for Discharge   Problem: Activity: Goal: Risk for activity intolerance will decrease Outcome: Adequate for Discharge   Problem: Nutrition: Goal: Adequate nutrition will be maintained Outcome: Adequate for Discharge   Problem: Coping: Goal: Level of anxiety will decrease Outcome: Adequate for Discharge   Problem: Elimination: Goal: Will not experience complications related to bowel motility Outcome: Adequate for Discharge Goal: Will not experience complications related to urinary retention Outcome: Adequate for Discharge   Problem: Pain Managment: Goal: General experience of comfort will improve Outcome: Adequate for Discharge   Problem: Safety: Goal: Ability to remain free from injury will improve Outcome: Adequate for Discharge   Problem: Skin Integrity: Goal: Risk for impaired skin integrity will decrease Outcome: Adequate for  Discharge   Problem: Education: Goal: Knowledge of secondary prevention will improve ([facility]) 04/05/2022 1542 by Santa Lighter, RN Outcome: Adequate for Discharge 04/05/2022 1213 by Santa Lighter, RN Outcome: Progressing   Problem: Coping: Goal: Will verbalize positive feelings about self 04/05/2022 1542 by Santa Lighter, RN Outcome: Adequate for Discharge 04/05/2022 1213 by Santa Lighter, RN Outcome: Progressing Goal: Will identify appropriate support needs 04/05/2022 1542 by Santa Lighter, RN Outcome: Adequate for Discharge 04/05/2022 1213 by Santa Lighter, RN Outcome: Progressing

## 2022-04-05 NOTE — Discharge Summary (Signed)
Physician Discharge Summary   Patient: Cameron Alexander MRN: [id number] DOB: 1940-11-19  Admit date:     04/03/2022  Discharge date: 04/05/22  Discharge Physician: Barton Dubois   PCP: Patient, No Pcp Per   Recommendations at discharge:  Repeat basic metabolic panel Repeat CBC to follow hemoglobin trend/stability Make sure patient has follow-up with ophthalmology service as instructed Continue assisting patient with tobacco cessation.  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 Clearence Ped on 04/04/22 Cameron Alexander is a 81 y.o. male with medical history significant of no known medical history as patient does not like to go to healthcare providers, presents the ED with a chief complaint of right-sided weakness, headache, nausea.  Patient reports that for several weeks he has had pain in his eye.  It shoots from his mouth to his eye to his temple.  He reports the pain is only there for seconds.  He does have blurry vision from time to time but reports its not associated with the pain.  Patient reports that he has had difficulty driving as well.  He has been crusting yellow line when he cannot see.  Patient reports that everything goes blurry like double vision when he is trying to see.  That only last for seconds as well.  Patient reports that he has had arm pain and right upper extremity weakness as well.  Patient reports he has had a chest pain that is been intermittent.  He has noticed no provocative factors.  He thinks may be positional changes make it come on.  When positional changes make it go away.  Patient describes his pain as sharp and shooting but deep and aching when it is not.  Patient reports that he has had palpitations that last for few  seconds.  They spontaneously resolved as well.  He reports double vision.  Patient has no other complaints at this time.   Patient does not smoke, does not drink.  He does use chewing tobacco.  He is vaccinated for COVID.  Patient is full code.  Assessment and Plan: * TIA (transient ischemic attack) - Right-sided weakness and numbness reported. - MRA head without contrast, MRI neck without contrast, MRI brain without contrast shows no acute intracranial process.  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 -- Neurology service consulted and recommendations provided for 21 days of dual antiplatelet therapy with subsequent use of aspirin on daily basis for secondary prevention. -Patient has also been started on statin. -Discharge still reporting intermittent episodes of diplopia; outpatient follow-up with ophthalmology service recommended. -If no abnormalities found by ophthalmology patient will benefit of outpatient myasthenia work-up during his outpatient neurology follow-up.  Gastroesophageal reflux disease -started on PPI -Lifestyle changes discussed with patient.  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 -Heart healthy diet discussed with patient.  Diplopia - Currently no symptoms present on my evaluation at time of discharge; patient expressing intermittent episodes. -Outpatient follow-up with ophthalmology service has been recommended -If no abnormalities found work-up for myasthenia gravis by neurology in the outpatient setting will be pursued.  Chest pain - Unpredictable, not associated  with dyspnea, exertion or nausea. - Negative troponin and no acute ischemic changes appreciated on EKG -2D echo reassuring -Patient has been started on PPI.    Tobacco use disorder - Patient  uses chewing tobacco - Cessation counseling provided.   Consultants: Neurology service Procedures performed: See below for x-ray reports. Disposition: Home Diet recommendation: Heart healthy diet.  DISCHARGE MEDICATION: Allergies as of 04/05/2022   No Known Allergies      Medication List     STOP taking these medications    predniSONE 10 MG (21) Tbpk tablet Commonly known as: STERAPRED UNI-PAK 21 TAB       TAKE these medications    aspirin 81 MG chewable tablet Chew 1 tablet (81 mg total) by mouth daily. Start taking on: April 06, 2022   atorvastatin 40 MG tablet Commonly known as: LIPITOR Take 1 tablet (40 mg total) by mouth at bedtime.   clopidogrel 75 MG tablet Commonly known as: PLAVIX Take 1 tablet (75 mg total) by mouth daily for 21 days. Start taking on: April 06, 2022   pantoprazole 40 MG tablet Commonly known as: Protonix Take 1 tablet (40 mg total) by mouth daily.        Follow-up Information     Phillips Odor, MD. Schedule an appointment as soon as possible for a visit in 6 week(s).   Specialty: Neurology Contact information: [address] [place] [address] [phone]                Discharge Exam: Filed Weights   04/03/22 1738  Weight: 84.6 kg   General exam: Alert, awake, oriented x 3; no acute distress.  Denying diplopia or blurred vision.  Mild right-sided ptosis appreciated. Respiratory system: Clear to auscultation. Respiratory effort normal.  Good saturation on room air. Cardiovascular system:RRR. No rub or gallop. Gastrointestinal system: Abdomen is nondistended, soft and nontender. No organomegaly or masses felt. Normal bowel sounds heard. Central nervous system: Alert and oriented. No focal neurological deficits. Extremities: No cyanosis or clubbing. Skin: No petechiae. Psychiatry: Judgement and insight appear normal. Mood & affect appropriate.    Condition at discharge: Stable and improved.  The results of  significant diagnostics from this hospitalization (including imaging, microbiology, ancillary and laboratory) are listed below for reference.   Imaging Studies: ECHOCARDIOGRAM COMPLETE  Result Date: 04/04/2022    ECHOCARDIOGRAM REPORT   Patient Name:   BREYLEN ANTILLA Date of Exam: 04/04/2022 Medical Rec #:  [id number]       Height:       66.0 in Accession #:    [id number]      Weight:       186.4 lb Date of Birth:  07-14-1940        BSA:          1.941 m Patient Age:    45 years        BP:           158/79 mmHg Patient Gender: M               HR:           56 bpm. Exam Location:  [facility] Procedure: 2D Echo, Cardiac Doppler and Color Doppler Indications:    TIA  History:        Patient has no prior history of Echocardiogram examinations.                 Signs/Symptoms:Chest Pain.  Sonographer:    [place] Referring Phys:  [id number] ASIA B ZIERLE-GHOSH IMPRESSIONS  1. Left ventricular ejection fraction, by estimation, is 60 to 65%. The left ventricle has normal function. The left ventricle has no regional wall motion abnormalities. There is mild left ventricular hypertrophy. Left ventricular diastolic parameters are consistent with Grade I diastolic dysfunction (impaired relaxation).  2. Right ventricular systolic function is normal. The right ventricular size is normal. There is normal pulmonary artery systolic pressure. The estimated right ventricular systolic pressure is 99991111 mmHg.  3. The mitral valve is normal in structure. No evidence of mitral valve regurgitation. No evidence of mitral stenosis.  4. The aortic valve is tricuspid. Aortic valve regurgitation is trivial. No aortic stenosis is present.  5. Aortic dilatation noted. There is dilatation of the ascending aorta, measuring 40 mm.  6. The inferior vena cava is normal in size with greater than 50% respiratory variability, suggesting right atrial pressure of 3 mmHg. FINDINGS  Left Ventricle: Left ventricular ejection fraction, by  estimation, is 60 to 65%. The left ventricle has normal function. The left ventricle has no regional wall motion abnormalities. The left ventricular internal cavity size was normal in size. There is  mild left ventricular hypertrophy. Left ventricular diastolic parameters are consistent with Grade I diastolic dysfunction (impaired relaxation). Right Ventricle: The right ventricular size is normal. No increase in right ventricular wall thickness. Right ventricular systolic function is normal. There is normal pulmonary artery systolic pressure. The tricuspid regurgitant velocity is 2.36 m/s, and  with an assumed right atrial pressure of 3 mmHg, the estimated right ventricular systolic pressure is [id number] mmHg. Left Atrium: Left atrial size was normal in size. Right Atrium: Right atrial size was normal in size. Pericardium: There is no evidence of pericardial effusion. Mitral Valve: The mitral valve is normal in structure. No evidence of mitral valve regurgitation. No evidence of mitral valve stenosis. Tricuspid Valve: The tricuspid valve is normal in structure. Tricuspid valve regurgitation is trivial. Aortic Valve: The aortic valve is tricuspid. Aortic valve regurgitation is trivial. Aortic regurgitation PHT measures 459 msec. No aortic stenosis is present. Pulmonic Valve: The pulmonic valve was not well visualized. Pulmonic valve regurgitation is not visualized. Aorta: The aortic root is normal in size and structure and aortic dilatation noted. There is dilatation of the ascending aorta, measuring 40 mm. Venous: The inferior vena cava is normal in size with greater than 50% respiratory variability, suggesting right atrial pressure of 3 mmHg. IAS/Shunts: The interatrial septum was not well visualized.  LEFT VENTRICLE PLAX 2D LVIDd:         4.60 cm   Diastology LVIDs:         3.10 cm   LV e' medial:    6.85 cm/s LV PW:         1.10 cm   LV E/e' medial:  10.4 LV IVS:        1.10 cm   LV e' lateral:   6.74 cm/s LVOT diam:      2.10 cm   LV E/e' lateral: 10.5 LV SV:         72 LV SV Index:   37 LVOT Area:     3.46 cm  RIGHT VENTRICLE             IVC RV S prime:     15.20 cm/s  IVC diam: 1.00 cm TAPSE (M-mode): 2.2 cm LEFT ATRIUM             Index        RIGHT  ATRIUM           Index LA diam:        3.70 cm 1.91 cm/m   RA Area:     15.70 cm LA Vol (A2C):   52.2 ml 26.90 ml/m  RA Volume:   39.20 ml  20.20 ml/m LA Vol (A4C):   35.4 ml 18.24 ml/m LA Biplane Vol: 46.1 ml 23.75 ml/m  AORTIC VALVE LVOT Vmax:   91.80 cm/s LVOT Vmean:  57.500 cm/s LVOT VTI:    0.208 m AI PHT:      459 msec  AORTA Ao Root diam: 3.40 cm Ao Asc diam:  4.00 cm MITRAL VALVE               TRICUSPID VALVE MV Area (PHT): 4.06 cm    TR Peak grad:   22.3 mmHg MV Decel Time: 187 msec    TR Vmax:        236.00 cm/s MV E velocity: 71.00 cm/s MV A velocity: 95.50 cm/s  SHUNTS MV E/A ratio:  0.74        Systemic VTI:  0.21 m                            Systemic Diam: 2.10 cm Oswaldo Milian MD Electronically signed by Oswaldo Milian MD Signature Date/Time: 04/04/2022/4:20:01 PM    Final    MR ANGIO ABDOMEN WO CONTRAST  Result Date: 04/03/2022 CLINICAL DATA:  Acute aortic syndrome (AAS) suspected; Aortic aneurysm, known or suspected EXAM: MRA CHEST AND ABDOMEN WITHOUT CONTRAST TECHNIQUE: Multiplanar, multiecho pulse sequences of the chest and abdomen were obtained without intravenous contrast. Angiographic images of chest and abdomen were obtained using MRA technique without intravenous contrast. CONTRAST:  No additional contrast was administered for this examination (contrast was previously administered at 6:19 p.m. for MRA examination of the head and neck) COMPARISON:  CT 01/08/2015 FINDINGS: MRA CHEST FINDINGS Residual contrast from prior examination limits assessment for intramural hematoma, however, no intramural hematoma is identified. The thoracic aorta is dilated measuring 4.2 cm in diameter in its ascending segment, 3.6 cm in diameter in its  proximal descending segment just beyond the arch, and 3.2 cm in diameter in its distal descending segment at the level of the left atrium. No dissection. Cardiac size within normal limits. No pericardial effusion. Central pulmonary arteries are of normal caliber. No pathologic thoracic adenopathy. Moderate hiatal hernia. Anterior wedge compression deformity of T12 appears chronic in nature. MRA ABDOMEN FINDINGS The aorta is of normal caliber. No aneurysm or dissection. Evaluation of the visceral vessels is limited in absence of contrast administration. 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 No definite visceral vessel stenosis identified on this limited examination. Simple cyst identified within the left hepatic lobe. Additional T2 hyperintense lesions within the liver are not accurately characterized on this examination but likely represent additional cysts. Gallbladder, pancreas, spleen, adrenal glands, are unremarkable. Multiple cystic lesions are identified within the kidneys bilaterally which are incompletely characterized in absence of contrast administration. No pathologic adenopathy. Visualized bowel is unremarkable. No bone abnormality. IMPRESSION: 1. 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 No dissection. Recommend annual imaging followup by CTA or MRA. This recommendation follows 2010 ACCF/AHA/AATS/ACR/ASA/SCA/SCAI/SIR/STS/SVM Guidelines for the Diagnosis and Management of Patients with Thoracic Aortic Disease. Circulation. 2010; [id number]. Aortic aneurysm NOS (ICD10-I71.9) 2. No aortic aneurysm or dissection. 3. Moderate  hiatal hernia. 4. 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 5. Multiple cystic lesions within the kidneys bilaterally, incompletely characterized in absence of contrast administration. These would be better assessed with dedicated contrast enhanced CT or MRI examination. 6. Mild fusiform  dilation of the celiac axis which measures 13 mm in diameter. 7. Multiple T2 hyperintense lesions within the liver are not accurately characterized on this examination but likely represent cysts. These, again, would be better assessed with dedicated imaging once the patient's acute issues have resolved. Electronically Signed   By: Fidela Salisbury M.D.   On: 04/03/2022 20:07   MR ANGIO CHEST WO CONTRAST  Result Date: 04/03/2022 CLINICAL DATA:  Acute aortic syndrome (AAS) suspected; Aortic aneurysm, known or suspected EXAM: MRA CHEST AND ABDOMEN WITHOUT CONTRAST TECHNIQUE: Multiplanar, multiecho pulse sequences of the chest and abdomen were obtained without intravenous contrast. Angiographic images of chest and abdomen were obtained using MRA technique without intravenous contrast. CONTRAST:  No additional contrast was administered for this examination (contrast was previously administered at 6:19 p.m. for MRA examination of the head and neck) COMPARISON:  CT 01/08/2015 FINDINGS: MRA CHEST FINDINGS Residual contrast from prior examination limits assessment for intramural hematoma, however, no intramural hematoma is identified. The thoracic aorta is dilated measuring 4.2 cm in diameter in its ascending segment, 3.6 cm in diameter in its proximal descending segment just beyond the arch, and 3.2 cm in diameter in its distal descending segment at the level of the left atrium. No dissection. Cardiac size within normal limits. No pericardial effusion. Central pulmonary arteries are of normal caliber. No pathologic thoracic adenopathy. Moderate hiatal hernia. Anterior wedge compression deformity of T12 appears chronic in nature. MRA ABDOMEN FINDINGS The aorta is of normal caliber. No aneurysm or dissection. Evaluation of the visceral vessels is limited in absence of contrast administration. 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 No definite visceral vessel stenosis identified on this  limited examination. Simple cyst identified within the left hepatic lobe. Additional T2 hyperintense lesions within the liver are not accurately characterized on this examination but likely represent additional cysts. Gallbladder, pancreas, spleen, adrenal glands, are unremarkable. Multiple cystic lesions are identified within the kidneys bilaterally which are incompletely characterized in absence of contrast administration. No pathologic adenopathy. Visualized bowel is unremarkable. No bone abnormality. IMPRESSION: 1. 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 No dissection. Recommend annual imaging followup by CTA or MRA. This recommendation follows 2010 ACCF/AHA/AATS/ACR/ASA/SCA/SCAI/SIR/STS/SVM Guidelines for the Diagnosis and Management of Patients with Thoracic Aortic Disease. Circulation. 2010; [id number]. Aortic aneurysm NOS (ICD10-I71.9) 2. No aortic aneurysm or dissection. 3. Moderate hiatal hernia. 4. 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 5. Multiple cystic lesions within the kidneys bilaterally, incompletely characterized in absence of contrast administration. These would be better assessed with dedicated contrast enhanced CT or MRI examination. 6. 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 7. Multiple T2 hyperintense lesions within the liver are not accurately characterized on this examination but likely represent cysts. These, again, would be better assessed with dedicated imaging once the patient's acute issues have resolved. Electronically Signed   By: Fidela Salisbury M.D.   On: 04/03/2022 20:07   MR ANGIO NECK W WO CONTRAST  Result Date: 04/03/2022 CLINICAL DATA:  Carotid artery stenosis suspected; right-sided weakness EXAM: MRA NECK WITHOUT AND WITH CONTRAST TECHNIQUE: Multiplanar and multiecho pulse sequences of the neck were obtained without  and with intravenous contrast. Angiographic images of the  neck were obtained using MRA technique without and with intravenous contrast. CONTRAST:  28mL GADAVIST GADOBUTROL 1 MMOL/ML IV SOLN COMPARISON:  None Available. FINDINGS: Evaluation is somewhat limited by bolus timing, with much of the contrast appearing to be in the venous system. Standard aortic branching. Imaged portion shows no evidence of aneurysm or dissection. No significant stenosis of the major arch vessel origins. Common, internal, and external carotid arteries are patent, without hemodynamically significant stenosis. Extracranial vertebral arteries are patent, without hemodynamically significant stenosis. IMPRESSION: No hemodynamically significant stenosis in the neck.  No Electronically Signed   By: Wiliam Ke M.D.   On: 04/03/2022 19:11   MR BRAIN WO CONTRAST  Result Date: 04/03/2022 CLINICAL DATA:  Right-sided symptoms, right eye blurry vision; stroke suspected EXAM: MRI HEAD WITHOUT CONTRAST MRA HEAD WITHOUT CONTRAST TECHNIQUE: Multiplanar, multi-echo pulse sequences of the brain and surrounding structures were acquired without intravenous contrast. Angiographic images of the Circle of Willis were acquired using MRA technique without intravenous contrast. COMPARISON:  No prior MRI, correlation is made with CT head 01/08/2015 FINDINGS: MRI HEAD FINDINGS Brain: No restricted diffusion to suggest acute or subacute infarct. No acute hemorrhage, mass, mass effect, or midline shift. No hydrocephalus or extra-axial collection. Cerebral volume is within normal limits for age. Vascular: Please see MRA findings below. Skull and upper cervical spine: Normal marrow signal. Sinuses/Orbits: Mucosal thickening in the ethmoid air cells. The orbits are unremarkable. Other: Fluid in the right mastoid air cells. MRA HEAD FINDINGS Anterior circulation: Both internal carotid arteries are patent to the termini, without significant stenosis. A1 segments patent, with multifocal stenosis and relatively poor signal in  the left A1 (series 10, image 100), although some of this may be artifactual. Normal anterior communicating artery. Anterior cerebral arteries are patent to their distal aspects. No M1 stenosis or occlusion. Relatively poor signal in the more inferior and anterior left M2 branch (series 10, image 100). Distal MCA branches perfused and symmetric. Posterior circulation: Vertebral arteries patent to the vertebrobasilar junction without stenosis. Basilar patent to its distal aspect. Superior cerebellar arteries patent bilaterally. Patent P1 segments. PCAs perfused to their distal aspects without stenosis. The bilateral posterior communicating arteries are not visualized. Anatomic variants: None significant IMPRESSION: 1. No acute intracranial process. No evidence of acute or subacute infarct. 2. No intracranial large vessel occlusion. 3. Relatively poor signal in the more inferior and anterior left M2 branch and in the left A1, which also demonstrates multifocal stenosis, which could be artifactual. Electronically Signed   By: Wiliam Ke M.D.   On: 04/03/2022 19:03   MR ANGIO HEAD WO CONTRAST  Result Date: 04/03/2022 CLINICAL DATA:  Right-sided symptoms, right eye blurry vision; stroke suspected EXAM: MRI HEAD WITHOUT CONTRAST MRA HEAD WITHOUT CONTRAST TECHNIQUE: Multiplanar, multi-echo pulse sequences of the brain and surrounding structures were acquired without intravenous contrast. Angiographic images of the Circle of Willis were acquired using MRA technique without intravenous contrast. COMPARISON:  No prior MRI, correlation is made with CT head 01/08/2015 FINDINGS: MRI HEAD FINDINGS Brain: No restricted diffusion to suggest acute or subacute infarct. No acute hemorrhage, mass, mass effect, or midline shift. No hydrocephalus or extra-axial collection. Cerebral volume is within normal limits for age. Vascular: Please see MRA findings below. Skull and upper cervical spine: Normal marrow signal. Sinuses/Orbits:  Mucosal thickening in the ethmoid air cells. The orbits are unremarkable. Other: Fluid in the right mastoid air cells. MRA HEAD FINDINGS Anterior circulation:  Both internal carotid arteries are patent to the termini, without significant stenosis. A1 segments patent, with multifocal stenosis and relatively poor signal in the left A1 (series 10, image 100), although some of this may be artifactual. Normal anterior communicating artery. Anterior cerebral arteries are patent to their distal aspects. No M1 stenosis or occlusion. Relatively poor signal in the more inferior and anterior left M2 branch (series 10, image 100). Distal MCA branches perfused and symmetric. Posterior circulation: Vertebral arteries patent to the vertebrobasilar junction without stenosis. Basilar patent to its distal aspect. Superior cerebellar arteries patent bilaterally. Patent P1 segments. PCAs perfused to their distal aspects without stenosis. The bilateral posterior communicating arteries are not visualized. Anatomic variants: None significant IMPRESSION: 1. No acute intracranial process. No evidence of acute or subacute infarct. 2. No intracranial large vessel occlusion. 3. Relatively poor signal in the more inferior and anterior left M2 branch and in the left A1, which also demonstrates multifocal stenosis, which could be artifactual. Electronically Signed   By: Merilyn Baba M.D.   On: 04/03/2022 19:03    Microbiology: No results found for this or any previous visit.  Labs: CBC: Recent Labs  Lab 04/03/22 1744 04/03/22 1751 04/04/22 0338  WBC 7.7  --  5.5  NEUTROABS 5.4  --   --   HGB 14.7 14.6 13.0  HCT 43.0 43.0 38.3*  MCV 87.2  --  89.7  PLT 179  --  [id number]*   Basic Metabolic Panel: Recent Labs  Lab 04/03/22 1744 04/03/22 1751 04/04/22 0338  NA 138 140 140  K 3.7 3.7 3.7  CL 107 106 109  CO2 23  --  27  GLUCOSE 100* 99 94  BUN 16 15 15   CREATININE 1.23 1.20 1.22  CALCIUM 9.1  --  8.7*  MG  --   --  2.1    Liver Function Tests: Recent Labs  Lab 04/03/22 1744 04/04/22 0338  AST 20 16  ALT 15 13  ALKPHOS 66 51  BILITOT 1.2 1.0  PROT 7.4 6.2*  ALBUMIN 4.2 3.5   CBG: Recent Labs  Lab 04/03/22 1737  GLUCAP 97    Discharge time spent: greater than 30 minutes.  Signed: Barton Dubois, MD [facility] 04/05/2022

## 2022-04-05 NOTE — Assessment & Plan Note (Signed)
-  after discussing with neurology and given concerns for TIA and moderate vascular disease, patient started on statin. -Heart healthy diet discussed with patient.

## 2022-04-05 NOTE — Care Management Obs Status (Signed)
[facility] NOTIFICATION   Patient Details  Name: Cameron Alexander MRN: [id number] Date of Birth: 01/05/1941   Medicare Observation Status Notification Given:  Yes    Tommy Medal 04/05/2022, 11:26 AM

## 2022-04-05 NOTE — Discharge Instructions (Signed)
  Providers Accepting New Patients in [place], [place]    [facility] [address], [address]  [place], [place] [address] [phone] Accepts most insurances  [facility] [address] [place], [place] [address] [phone] Accepts most insurances  [facility] [place] [address] [place], [place] [address]  [phone] Must meet requirements  [facility] [address]  [place], [place] [address] [phone] Accepts most insurances  [facility] [address]  [place], [place] [address] [phone] Accepts most insurances  [facility] [address]   [place], [place]   [phone] Accepts most insurances  [facility] ([facility])  [address]  [place], [place] [address] [phone] Accepts most insurances     [facility] [address]  [place], [place] [address] [phone] Accepts most insurances  [facility] [facility] [address] [place], [place] [address] [phone] option 1 Accepts Medicaid and Uninsured  [facility] [address]  [place], [place] [address] [phone] Accepts most insurances  Tesfaye Fanta, MD [address]  [place], [place] [address] [phone] Accepts most insurances  [facility] [place] [address]  [place], [place] [address] [phone] Accepts most insurances  [facility] [address] [place],  [address] [phone] Accepts most insurances  Zack Hall, MD [address] [place],  [address] [phone]  Accepts most insurances                    

## 2022-04-05 NOTE — Assessment & Plan Note (Signed)
-  started on PPI -Lifestyle changes discussed with patient.

## 2022-04-05 NOTE — Progress Notes (Signed)
Nsg Discharge Note  Admit Date:  04/03/2022 Discharge date: 04/05/2022   [name] to be D/C'd Home per MD order.  AVS completed.   Removed IV-CDI. Reviewed d/c instructions with patient and wife. Gave preprinted prescriptions. Student RN wheeled stable patient and belongings to ED entrance. Patient/caregiver able to verbalize understanding.  Discharge Medication: Allergies as of 04/05/2022   No Known Allergies      Medication List     STOP taking these medications    predniSONE 10 MG (21) Tbpk tablet Commonly known as: STERAPRED UNI-PAK 21 TAB       TAKE these medications    aspirin 81 MG chewable tablet Chew 1 tablet (81 mg total) by mouth daily. Start taking on: April 06, 2022   atorvastatin 40 MG tablet Commonly known as: LIPITOR Take 1 tablet (40 mg total) by mouth at bedtime.   clopidogrel 75 MG tablet Commonly known as: PLAVIX Take 1 tablet (75 mg total) by mouth daily for 21 days. Start taking on: April 06, 2022   pantoprazole 40 MG tablet Commonly known as: Protonix Take 1 tablet (40 mg total) by mouth daily.        Discharge Assessment: Vitals:   04/05/22 0128 04/05/22 0537  BP: (!) 161/73 (!) 147/67  Pulse: (!) 58 63  Resp: 18 18  Temp: (!) 97 F (36.1 C) (!) 96.9 F (36.1 C)  SpO2: 99% 97%   Skin clean, dry and intact without evidence of skin break down, no evidence of skin tears noted. IV catheter discontinued intact. Site without signs and symptoms of complications - no redness or edema noted at insertion site, patient denies c/o pain - only slight tenderness at site.  Dressing with slight pressure applied.  D/c Instructions-Education: Discharge instructions given to patient/family with verbalized understanding. D/c education completed with patient/family including follow up instructions, medication list, d/c activities limitations if indicated, with other d/c instructions as indicated by MD - patient able to verbalize  understanding, all questions fully answered. Patient instructed to return to ED, call 911, or call MD for any changes in condition.  Patient escorted via [place], and D/C home via private auto.  Santa Lighter, RN 04/05/2022 4:21 PM

## 2022-04-05 NOTE — Plan of Care (Signed)
  Problem: Education: Goal: Knowledge of General Education information will improve Description: Including pain rating scale, medication(s)/side effects and non-pharmacologic comfort measures 04/05/2022 1212 by Santa Lighter, RN Outcome: Progressing 04/05/2022 0932 by Santa Lighter, RN Outcome: Progressing   Problem: Health Behavior/Discharge Planning: Goal: Ability to manage health-related needs will improve Outcome: Progressing   Problem: Education: Goal: Knowledge of secondary prevention will improve (SELECT ALL) Outcome: Progressing   Problem: Coping: Goal: Will verbalize positive feelings about self Outcome: Progressing Goal: Will identify appropriate support needs Outcome: Progressing

## 2022-04-05 NOTE — Plan of Care (Signed)
  Problem: Education: Goal: Knowledge of General Education information will improve Description Including pain rating scale, medication(s)/side effects and non-pharmacologic comfort measures Outcome: Progressing   Problem: Health Behavior/Discharge Planning: Goal: Ability to manage health-related needs will improve Outcome: Progressing   

## 2022-04-05 NOTE — Assessment & Plan Note (Signed)
-   Currently no symptoms present on my evaluation at time of discharge; patient expressing intermittent episodes. -Outpatient follow-up with ophthalmology service has been recommended -If no abnormalities found work-up for myasthenia gravis by neurology in the outpatient setting will be pursued.

## 2022-04-05 NOTE — TOC Transition Note (Signed)
Transition of Care [facility]) - CM/SW Discharge Note   Patient Details  Name: Cameron Alexander MRN: [id number] Date of Birth: [birth year]  [facility] CM/SW Contact:  Boneta Lucks, RN Phone Number: 04/05/2022, 2:15 PM   Clinical Narrative:   Patient discharging home. Need a PCP list to find a doctor. List added to discharge instruction.    Final next level of care: Home/Self Care Barriers to Discharge: No Barriers Identified

## 2022-04-15 ENCOUNTER — Ambulatory Visit (INDEPENDENT_AMBULATORY_CARE_PROVIDER_SITE_OTHER): Payer: Medicare Other | Admitting: Internal Medicine

## 2022-04-15 ENCOUNTER — Encounter: Payer: Self-pay | Admitting: Internal Medicine

## 2022-04-15 VITALS — BP 131/66 | HR 97 | Ht 67.0 in | Wt 184.0 lb

## 2022-04-15 DIAGNOSIS — K219 Gastro-esophageal reflux disease without esophagitis: Secondary | ICD-10-CM | POA: Diagnosis not present

## 2022-04-15 DIAGNOSIS — E538 Deficiency of other specified B group vitamins: Secondary | ICD-10-CM

## 2022-04-15 DIAGNOSIS — Z23 Encounter for immunization: Secondary | ICD-10-CM | POA: Diagnosis not present

## 2022-04-15 DIAGNOSIS — F17200 Nicotine dependence, unspecified, uncomplicated: Secondary | ICD-10-CM | POA: Diagnosis not present

## 2022-04-15 DIAGNOSIS — E782 Mixed hyperlipidemia: Secondary | ICD-10-CM | POA: Diagnosis not present

## 2022-04-15 DIAGNOSIS — Z7689 Persons encountering health services in other specified circumstances: Secondary | ICD-10-CM

## 2022-04-15 DIAGNOSIS — G459 Transient cerebral ischemic attack, unspecified: Secondary | ICD-10-CM

## 2022-04-15 NOTE — Progress Notes (Signed)
New Patient Office Visit  Subjective    Patient ID: Cameron Alexander, male    DOB: 11-06-1940  Age: 82 y.o. MRN: [id number]  CC:  Chief Complaint  Patient presents with   Establish Care    HPI Cameron Alexander presents to establish care.  He is an 81 year old male with a past medical history significant for recent TIA, GERD, HLD, tobacco use, and vitamin B12 deficiency.  Cameron Alexander has never had a primary care provider.  He previously presented to urgent care for acute needs.  He was recently admitted to [facility] 10/11 - 10/13 after presenting with transient right-sided weakness and a headache.  He was found to have a TIA.  Discharged on DAPT x21 days, atorvastatin, and Protonix.  Referred to our practice to establish care.  Today Cameron Alexander states that he feels well.  He has had no recurrence of his previous symptoms.  He is accompanied by his wife today. Cameron Alexander has been taking his medication as prescribed.  He is otherwise asymptomatic and without acute concerns.  Chronic medical conditions and outstanding preventative healthcare maintenance items discussed today are individually addressed in A/P below.  Outpatient Encounter Medications as of 04/15/2022  Medication Sig   aspirin 81 MG chewable tablet Chew 1 tablet (81 mg total) by mouth daily.   atorvastatin (LIPITOR) 40 MG tablet Take 1 tablet (40 mg total) by mouth at bedtime.   clopidogrel (PLAVIX) 75 MG tablet Take 1 tablet (75 mg total) by mouth daily for 21 days.   pantoprazole (PROTONIX) 40 MG tablet Take 1 tablet (40 mg total) by mouth daily.   No facility-administered encounter medications on file as of 04/15/2022.    History reviewed. No pertinent past medical history.  Past Surgical History:  Procedure Laterality Date   ESOPHAGOGASTRODUODENOSCOPY ENDOSCOPY      History reviewed. No pertinent family history.  Social History   Socioeconomic History   Marital status: Married    Spouse name: Not on file    Number of children: Not on file   Years of education: Not on file   Highest education level: Not on file  Occupational History   Not on file  Tobacco Use   Smoking status: Never   Smokeless tobacco: Current  Substance and Sexual Activity   Alcohol use: Yes    Alcohol/week: 2.0 standard drinks of alcohol    Types: 2 Cans of beer per week    Comment: occasional   Drug use: No   Sexual activity: Not on file  Other Topics Concern   Not on file  Social History Narrative   Not on file   Social Determinants of Health   Financial Resource Strain: Not on file  Food Insecurity: No Food Insecurity (04/05/2022)   Hunger Vital Sign    Worried About Running Out of Food in the Last Year: Never true    Ran Out of Food in the Last Year: Never true  Transportation Needs: No Transportation Needs (04/05/2022)   PRAPARE - Administrator, Civil Service (Medical): No    Lack of Transportation (Non-Medical): No  Physical Activity: Not on file  Stress: Not on file  Social Connections: Not on file  Intimate Partner Violence: Not At Risk (04/05/2022)   Humiliation, Afraid, Rape, and Kick questionnaire    Fear of Current or Ex-Partner: No    Emotionally Abused: No    Physically Abused: No    Sexually Abused: No   Review  of Systems  Constitutional:  Negative for chills and fever.  HENT:  Negative for sore throat.   Respiratory:  Negative for cough and shortness of breath.   Cardiovascular:  Negative for chest pain, palpitations and leg swelling.  Gastrointestinal:  Negative for abdominal pain, blood in stool, constipation, diarrhea, nausea and vomiting.  Genitourinary:  Negative for dysuria and hematuria.  Musculoskeletal:  Negative for myalgias.  Skin:  Negative for itching and rash.  Neurological:  Negative for dizziness and headaches.  Psychiatric/Behavioral:  Negative for depression and suicidal ideas.     Objective    BP 131/66   Pulse 97   Ht 5\' 7"  (1.702 m)   Wt 184  lb (83.5 kg)   SpO2 93%   BMI 28.82 kg/m   Physical Exam Vitals reviewed.  Constitutional:      General: He is not in acute distress.    Appearance: Normal appearance. He is not ill-appearing.  HENT:     Head: Normocephalic and atraumatic.     Nose: Nose normal. No congestion or rhinorrhea.     Mouth/Throat:     Mouth: Mucous membranes are moist.     Pharynx: Oropharynx is clear.  Eyes:     Extraocular Movements: Extraocular movements intact.     Conjunctiva/sclera: Conjunctivae normal.     Pupils: Pupils are equal, round, and reactive to light.  Cardiovascular:     Rate and Rhythm: Normal rate and regular rhythm.     Pulses: Normal pulses.     Heart sounds: Normal heart sounds. No murmur heard. Pulmonary:     Effort: Pulmonary effort is normal.     Breath sounds: Normal breath sounds. No wheezing, rhonchi or rales.  Abdominal:     General: Abdomen is flat. Bowel sounds are normal. There is no distension.     Palpations: Abdomen is soft.     Tenderness: There is no abdominal tenderness.  Musculoskeletal:        General: No swelling or deformity. Normal range of motion.     Cervical back: Normal range of motion.  Skin:    General: Skin is warm and dry.     Capillary Refill: Capillary refill takes less than 2 seconds.  Neurological:     General: No focal deficit present.     Mental Status: He is alert and oriented to person, place, and time.     Cranial Nerves: No cranial nerve deficit.     Sensory: No sensory deficit.     Motor: No weakness.     Coordination: Coordination normal.     Gait: Gait normal.  Psychiatric:        Mood and Affect: Mood normal.        Behavior: Behavior normal.        Thought Content: Thought content normal.    Last CBC Lab Results  Component Value Date   WBC 5.5 04/04/2022   HGB 13.0 04/04/2022   HCT 38.3 (L) 04/04/2022   MCV 89.7 04/04/2022   MCH 30.4 04/04/2022   RDW 14.6 04/04/2022   PLT 143 (L) 04/04/2022   Last metabolic  panel Lab Results  Component Value Date   GLUCOSE 94 04/04/2022   NA 140 04/04/2022   K 3.7 04/04/2022   CL 109 04/04/2022   CO2 27 04/04/2022   BUN 15 04/04/2022   CREATININE 1.22 04/04/2022   GFRNONAA 59 (L) 04/04/2022   CALCIUM 8.7 (L) 04/04/2022   PROT 6.2 (L) 04/04/2022   ALBUMIN 3.5  04/04/2022   BILITOT 1.0 04/04/2022   ALKPHOS 51 04/04/2022   AST 16 04/04/2022   ALT 13 04/04/2022   ANIONGAP 4 (L) 04/04/2022   Last lipids Lab Results  Component Value Date   CHOL 131 04/04/2022   HDL 43 04/04/2022   LDLCALC 78 04/04/2022   TRIG 49 04/04/2022   CHOLHDL 3.0 04/04/2022   Last hemoglobin A1c Lab Results  Component Value Date   HGBA1C 5.1 04/03/2022   Last thyroid functions Lab Results  Component Value Date   TSH 3.567 04/04/2022   Last vitamin B12 and Folate Lab Results  Component Value Date   VITAMINB12 153 (L) 04/04/2022        Assessment & Plan:   Problem List Items Addressed This Visit       TIA (transient ischemic attack)    Presenting to [facility] on 10/11 with right-sided weakness and a headache.  Diagnosed with TIA.  Discharged 10/13 on DAPT (ASA + Plavix) x21 days with instructions to continue ASA 81 mg indefinitely.  He has been taking his medications as prescribed.  We reviewed that he will continue taking aspirin 81 mg daily once he completes 21 days of Plavix.  He has a normal neurologic exam today and denies any recurrence of his symptoms recently. -Continue DAPT x21 days, then continue ASA 81 mg daily indefinitely -Continue atorvastatin 40 mg daily -He will follow-up with neurology next month      Gastroesophageal reflux disease    Symptoms well controlled on pantoprazole 40 mg daily      Tobacco use disorder    He continues to use snuff.  Counseled on cessation.      Moderate mixed hyperlipidemia not requiring statin therapy    High intensity statin therapy started during recent admission for TIA.  He is compliant with atorvastatin 40 mg  daily.  No changes today.      Vitamin B12 deficiency    B12 level 153 during recent admission.  No prior history of abdominal surgeries. -B12 supplementation recommended      Encounter to establish care    Presenting today to establish care.  Recent medical records and labs reviewed. -Influenza vaccine administered today  -I recommended that he receive outstanding Shingrix/COVID-19 vaccines at his pharmacy.       Return in about 3 months (around 07/16/2022).   Johnette Abraham, MD

## 2022-04-15 NOTE — Assessment & Plan Note (Signed)
Presenting today to establish care.  Recent medical records and labs reviewed. -Influenza vaccine administered today  -I recommended that he receive outstanding Shingrix/COVID-19 vaccines at his pharmacy.

## 2022-04-15 NOTE — Assessment & Plan Note (Signed)
High intensity statin therapy started during recent admission for TIA.  He is compliant with atorvastatin 40 mg daily.  No changes today.

## 2022-04-15 NOTE — Assessment & Plan Note (Signed)
Symptoms well controlled on pantoprazole 40 mg daily.

## 2022-04-15 NOTE — Assessment & Plan Note (Signed)
Presenting to [facility] on 10/11 with right-sided weakness and a headache.  Diagnosed with TIA.  Discharged 10/13 on DAPT (ASA + Plavix) x21 days with instructions to continue ASA 81 mg indefinitely.  He has been taking his medications as prescribed.  We reviewed that he will continue taking aspirin 81 mg daily once he completes 21 days of Plavix.  He has a normal neurologic exam today and denies any recurrence of his symptoms recently. -Continue DAPT x21 days, then continue ASA 81 mg daily indefinitely -Continue atorvastatin 40 mg daily -He will follow-up with neurology next month

## 2022-04-15 NOTE — Assessment & Plan Note (Signed)
B12 level 153 during recent admission.  No prior history of abdominal surgeries. -B12 supplementation recommended

## 2022-04-15 NOTE — Assessment & Plan Note (Signed)
He continues to use snuff.  Counseled on cessation.

## 2022-04-15 NOTE — Patient Instructions (Signed)
It was a pleasure to see you today.  Thank you for giving [place] the opportunity to be involved in your care.  Below is a brief recap of your visit and next steps.  We will plan to see you again in 3 months.  Summary You have established care today. No medication changes for now. Continue taking aspirin 81 mg daily after you finish Plavix.  You will receive your flu shot today.  Next steps Follow up in 3 months Please let me know if you need anything in the interim

## 2022-05-21 ENCOUNTER — Telehealth: Payer: Self-pay | Admitting: Internal Medicine

## 2022-05-21 ENCOUNTER — Other Ambulatory Visit: Payer: Self-pay

## 2022-05-21 MED ORDER — ATORVASTATIN CALCIUM 40 MG PO TABS: 40.0000 mg | ORAL_TABLET | Freq: Every day | ORAL | 2 refills | Status: DC

## 2022-05-21 NOTE — Telephone Encounter (Signed)
  Prescription Request  05/21/2022  Is this a "Controlled Substance" medicine? No  LOV: 04/15/2022   What is the name of the medication or equipment? atorvastatin (LIPITOR) 40 MG tablet [[id number]]   Have you contacted your pharmacy to request a refill? No   Which pharmacy would you like this sent to?  [id number] - [place], [place] - [address] [place] [address] Phone: [phone] Fax: [phone]   Patient notified that their request is being sent to the clinical staff for review and that they should receive a response within 2 business days.   Please advise at

## 2022-07-22 ENCOUNTER — Ambulatory Visit: Payer: Medicare Other | Admitting: Internal Medicine

## 2022-07-23 ENCOUNTER — Encounter: Payer: Self-pay | Admitting: Internal Medicine

## 2022-07-23 ENCOUNTER — Ambulatory Visit (INDEPENDENT_AMBULATORY_CARE_PROVIDER_SITE_OTHER): Payer: Medicare Other | Admitting: Internal Medicine

## 2022-07-23 VITALS — BP 160/83 | HR 78 | Ht 67.0 in | Wt 189.8 lb

## 2022-07-23 DIAGNOSIS — G459 Transient cerebral ischemic attack, unspecified: Secondary | ICD-10-CM

## 2022-07-23 DIAGNOSIS — I10 Essential (primary) hypertension: Secondary | ICD-10-CM | POA: Diagnosis not present

## 2022-07-23 DIAGNOSIS — E538 Deficiency of other specified B group vitamins: Secondary | ICD-10-CM | POA: Diagnosis not present

## 2022-07-23 DIAGNOSIS — K219 Gastro-esophageal reflux disease without esophagitis: Secondary | ICD-10-CM | POA: Diagnosis not present

## 2022-07-23 DIAGNOSIS — E782 Mixed hyperlipidemia: Secondary | ICD-10-CM

## 2022-07-23 DIAGNOSIS — F419 Anxiety disorder, unspecified: Secondary | ICD-10-CM

## 2022-07-23 MED ORDER — HYDROXYZINE PAMOATE 25 MG PO CAPS: 25.0000 mg | ORAL_CAPSULE | Freq: Three times a day (TID) | ORAL | 0 refills | Status: DC | PRN

## 2022-07-23 MED ORDER — OLMESARTAN MEDOXOMIL 20 MG PO TABS: 20.0000 mg | ORAL_TABLET | Freq: Every day | ORAL | 2 refills | Status: DC

## 2022-07-23 MED ORDER — ATORVASTATIN CALCIUM 40 MG PO TABS: 40.0000 mg | ORAL_TABLET | Freq: Every day | ORAL | 1 refills | Status: DC

## 2022-07-23 MED ORDER — PANTOPRAZOLE SODIUM 40 MG PO TBEC: 40.0000 mg | DELAYED_RELEASE_TABLET | Freq: Every day | ORAL | 1 refills | Status: DC

## 2022-07-23 NOTE — Patient Instructions (Signed)
It was a pleasure to see you today.  Thank you for giving [place] the opportunity to be involved in your care.  Below is a brief recap of your visit and next steps.  We will plan to see you again in 2-4 weeks.  Summary Add olmesartan 20 mg daily for hypertension Follow up in 2-4 weeks for blood pressure check I have placed a neurology referral today Repeat vitamin B12 level

## 2022-07-23 NOTE — Progress Notes (Signed)
Established Patient Office Visit  Subjective   Patient ID: CABLE FEARN, male    DOB: 13-Mar-1941  Age: 82 y.o. MRN: [id number]  Chief Complaint  Patient presents with   Transient Ischemic Attack    Follow up   Mr. Leard returns to care today.  He was last seen by me on 04/15/22 as a new patient presenting to establish care.  He had recently been admitted to [facility] for TIA.  24-month follow-up was arranged.  There have been no acute interval events. Mr. Lortie reports feeling well today. He is asymptomatic and has no acute concerns to discuss.   History reviewed. No pertinent past medical history. Past Surgical History:  Procedure Laterality Date   ESOPHAGOGASTRODUODENOSCOPY ENDOSCOPY     Social History   Tobacco Use   Smoking status: Never   Smokeless tobacco: Current  Substance Use Topics   Alcohol use: Yes    Alcohol/week: 2.0 standard drinks of alcohol    Types: 2 Cans of beer per week    Comment: occasional   Drug use: No   History reviewed. No pertinent family history. No Known Allergies  Review of Systems  Constitutional:  Negative for chills and fever.  HENT:  Negative for sore throat.   Respiratory:  Negative for cough and shortness of breath.   Cardiovascular:  Negative for chest pain, palpitations and leg swelling.  Gastrointestinal:  Negative for abdominal pain, blood in stool, constipation, diarrhea, nausea and vomiting.  Genitourinary:  Negative for dysuria and hematuria.  Musculoskeletal:  Negative for myalgias.  Skin:  Negative for itching and rash.  Neurological:  Negative for dizziness and headaches.  Psychiatric/Behavioral:  Negative for depression and suicidal ideas.      Objective:     BP (!) 160/83   Pulse 78   Ht 5\' 7"  (1.702 m)   Wt 189 lb 12.8 oz (86.1 kg)   SpO2 95%   BMI 29.73 kg/m  BP Readings from Last 3 Encounters:  07/23/22 (!) 160/83  04/15/22 131/66  04/05/22 (!) 147/67   Physical Exam Vitals reviewed.   Constitutional:      General: He is not in acute distress.    Appearance: Normal appearance. He is not ill-appearing.  HENT:     Head: Normocephalic and atraumatic.     Nose: Nose normal. No congestion or rhinorrhea.     Mouth/Throat:     Mouth: Mucous membranes are moist.     Pharynx: Oropharynx is clear.  Eyes:     Extraocular Movements: Extraocular movements intact.     Conjunctiva/sclera: Conjunctivae normal.     Pupils: Pupils are equal, round, and reactive to light.  Cardiovascular:     Rate and Rhythm: Normal rate and regular rhythm.     Pulses: Normal pulses.     Heart sounds: Normal heart sounds. No murmur heard. Pulmonary:     Effort: Pulmonary effort is normal.     Breath sounds: Normal breath sounds. No wheezing, rhonchi or rales.  Abdominal:     General: Abdomen is flat. Bowel sounds are normal. There is no distension.     Palpations: Abdomen is soft.     Tenderness: There is no abdominal tenderness.  Musculoskeletal:        General: No swelling or deformity. Normal range of motion.     Cervical back: Normal range of motion.  Skin:    General: Skin is warm and dry.     Capillary Refill: Capillary refill takes  less than 2 seconds.  Neurological:     General: No focal deficit present.     Mental Status: He is alert and oriented to person, place, and time.     Cranial Nerves: No cranial nerve deficit.     Sensory: No sensory deficit.     Motor: No weakness.     Coordination: Coordination normal.     Gait: Gait normal.  Psychiatric:        Mood and Affect: Mood normal.        Behavior: Behavior normal.        Thought Content: Thought content normal.   Last CBC Lab Results  Component Value Date   WBC 5.5 04/04/2022   HGB 13.0 04/04/2022   HCT 38.3 (L) 04/04/2022   MCV 89.7 04/04/2022   MCH 30.4 04/04/2022   RDW 14.6 04/04/2022   PLT 143 (L) 76/16/0737   Last metabolic panel Lab Results  Component Value Date   GLUCOSE 94 04/04/2022   NA 140  04/04/2022   K 3.7 04/04/2022   CL 109 04/04/2022   CO2 27 04/04/2022   BUN 15 04/04/2022   CREATININE 1.22 04/04/2022   GFRNONAA 59 (L) 04/04/2022   CALCIUM 8.7 (L) 04/04/2022   PROT 6.2 (L) 04/04/2022   ALBUMIN 3.5 04/04/2022   BILITOT 1.0 04/04/2022   ALKPHOS 51 04/04/2022   AST 16 04/04/2022   ALT 13 04/04/2022   ANIONGAP 4 (L) 04/04/2022   Last lipids Lab Results  Component Value Date   CHOL 131 04/04/2022   HDL 43 04/04/2022   LDLCALC 78 04/04/2022   TRIG 49 04/04/2022   CHOLHDL 3.0 04/04/2022   Last hemoglobin A1c Lab Results  Component Value Date   HGBA1C 5.1 04/03/2022   Last thyroid functions Lab Results  Component Value Date   TSH 3.567 04/04/2022   Last vitamin B12 and Folate Lab Results  Component Value Date   VITAMINB12 406 07/23/2022   FOLATE 14.1 07/23/2022     Assessment & Plan:   Problem List Items Addressed This Visit       TIA (transient ischemic attack)    History of TIA in October 2023.  Presented to [facility] ED on 10/11 with right-sided weakness and a headache.  Discharged 10/13 on DAPT (ASA + Plavix) x 21 days with instructions to continue ASA 81 mg indefinitely.  Neurology follow-up recommended.  He has not had any recurrence of his previous symptoms. -Continue ASA 81 mg daily and atorvastatin -Neurology referral placed today      Essential hypertension - Primary    His blood pressure significantly elevated today, 157/83 initially and 160/83 on repeat.  He is not currently on any antihypertensive medications, but meets criteria for diagnosis of hypertension. -Start olmesartan 20 mg daily -Follow-up in 2-4 weeks for HTN check      Vitamin B12 deficiency    Currently on vitamin B12 supplementation.  Repeat vitamin B-12 level ordered today.      Anxiety    Hydroxyzine prescribed for as needed anxiety relief       Return in about 2 weeks (around 08/06/2022) for HTN.    Johnette Abraham, MD

## 2022-07-24 LAB — B12 AND FOLATE PANEL
Folate: 14.1 ng/mL (ref >3.0)
Vitamin B-12: 406 pg/mL (ref 232–1245)

## 2022-07-28 DIAGNOSIS — I10 Essential (primary) hypertension: Secondary | ICD-10-CM | POA: Insufficient documentation

## 2022-07-28 DIAGNOSIS — F419 Anxiety disorder, unspecified: Secondary | ICD-10-CM | POA: Insufficient documentation

## 2022-07-28 NOTE — Assessment & Plan Note (Signed)
His blood pressure significantly elevated today, 157/83 initially and 160/83 on repeat.  He is not currently on any antihypertensive medications, but meets criteria for diagnosis of hypertension. -Start olmesartan 20 mg daily -Follow-up in 2-4 weeks for HTN check

## 2022-07-28 NOTE — Assessment & Plan Note (Signed)
History of TIA in October 2023.  Presented to [facility] ED on 10/11 with right-sided weakness and a headache.  Discharged 10/13 on DAPT (ASA + Plavix) x 21 days with instructions to continue ASA 81 mg indefinitely.  Neurology follow-up recommended.  He has not had any recurrence of his previous symptoms. -Continue ASA 81 mg daily and atorvastatin -Neurology referral placed today

## 2022-07-28 NOTE — Assessment & Plan Note (Signed)
Currently on vitamin B12 supplementation.  Repeat vitamin B-12 level ordered today.

## 2022-07-28 NOTE — Assessment & Plan Note (Signed)
Hydroxyzine prescribed for as needed anxiety relief

## 2022-08-16 ENCOUNTER — Encounter: Payer: Self-pay | Admitting: Internal Medicine

## 2022-08-16 ENCOUNTER — Ambulatory Visit (INDEPENDENT_AMBULATORY_CARE_PROVIDER_SITE_OTHER): Payer: Medicare Other | Admitting: Internal Medicine

## 2022-08-16 VITALS — BP 129/72 | HR 100 | Ht 67.0 in | Wt 195.0 lb

## 2022-08-16 DIAGNOSIS — F419 Anxiety disorder, unspecified: Secondary | ICD-10-CM

## 2022-08-16 DIAGNOSIS — I10 Essential (primary) hypertension: Secondary | ICD-10-CM | POA: Diagnosis not present

## 2022-08-16 NOTE — Patient Instructions (Signed)
It was a pleasure to see you today.  Thank you for giving [place] the opportunity to be involved in your care.  Below is a brief recap of your visit and next steps.  We will plan to see you again in 3 months.  Summary No medication changes today. Your blood pressure has improved. Follow up in 3 months.

## 2022-08-16 NOTE — Progress Notes (Unsigned)
Established Patient Office Visit  Subjective   Patient ID: Cameron Alexander, male    DOB: Mar 14, 1941  Age: 82 y.o. MRN: [id number]  Chief Complaint  Patient presents with   Hypertension    Follow up   Cameron Alexander returns to care today for HTN follow-up.  He was last seen by me on 1/30 at which time olmesartan 20 mg daily was started for treatment of hypertension.  Hydroxyzine 25 mg as needed for anxiety relief was also prescribed.  There have been no acute interval events.  Cameron Alexander reports feeling well today.  He states that hydroxyzine has been beneficial in relieving his anxiety.  He has no additional concerns to discuss today.  History reviewed. No pertinent past medical history. Past Surgical History:  Procedure Laterality Date   ESOPHAGOGASTRODUODENOSCOPY ENDOSCOPY     Social History   Tobacco Use   Smoking status: Never   Smokeless tobacco: Current  Substance Use Topics   Alcohol use: Yes    Alcohol/week: 2.0 standard drinks of alcohol    Types: 2 Cans of beer per week    Comment: occasional   Drug use: No   History reviewed. No pertinent family history. No Known Allergies  Review of Systems  Constitutional:  Negative for chills and fever.  HENT:  Negative for sore throat.   Respiratory:  Negative for cough and shortness of breath.   Cardiovascular:  Negative for chest pain, palpitations and leg swelling.  Gastrointestinal:  Negative for abdominal pain, blood in stool, constipation, diarrhea, nausea and vomiting.  Genitourinary:  Negative for dysuria and hematuria.  Musculoskeletal:  Negative for myalgias.  Skin:  Negative for itching and rash.  Neurological:  Negative for dizziness and headaches.  Psychiatric/Behavioral:  Negative for depression and suicidal ideas.      Objective:     BP 129/72   Pulse 100   Ht '5\' 7"'$  (1.702 m)   Wt 195 lb (88.5 kg)   SpO2 96%   BMI 30.54 kg/m  BP Readings from Last 3 Encounters:  08/16/22 129/72  07/23/22 (!) 160/83   04/15/22 131/66   Physical Exam Vitals reviewed.  Constitutional:      General: He is not in acute distress.    Appearance: Normal appearance. He is not ill-appearing.  HENT:     Head: Normocephalic and atraumatic.     Nose: Nose normal. No congestion or rhinorrhea.     Mouth/Throat:     Mouth: Mucous membranes are moist.     Pharynx: Oropharynx is clear.  Eyes:     Extraocular Movements: Extraocular movements intact.     Conjunctiva/sclera: Conjunctivae normal.     Pupils: Pupils are equal, round, and reactive to light.  Cardiovascular:     Rate and Rhythm: Normal rate and regular rhythm.     Pulses: Normal pulses.     Heart sounds: Normal heart sounds. No murmur heard. Pulmonary:     Effort: Pulmonary effort is normal.     Breath sounds: Normal breath sounds. No wheezing, rhonchi or rales.  Abdominal:     General: Abdomen is flat. Bowel sounds are normal. There is no distension.     Palpations: Abdomen is soft.     Tenderness: There is no abdominal tenderness.  Musculoskeletal:        General: No swelling or deformity. Normal range of motion.     Cervical back: Normal range of motion.  Skin:    General: Skin is warm and dry.  Capillary Refill: Capillary refill takes less than 2 seconds.  Neurological:     General: No focal deficit present.     Mental Status: He is alert and oriented to person, place, and time.     Cranial Nerves: No cranial nerve deficit.     Sensory: No sensory deficit.     Motor: No weakness.     Coordination: Coordination normal.     Gait: Gait normal.  Psychiatric:        Mood and Affect: Mood normal.        Behavior: Behavior normal.        Thought Content: Thought content normal.   Last CBC Lab Results  Component Value Date   WBC 5.5 04/04/2022   HGB 13.0 04/04/2022   HCT 38.3 (L) 04/04/2022   MCV 89.7 04/04/2022   MCH 30.4 04/04/2022   RDW 14.6 04/04/2022   PLT 143 (L) [id number]   Last metabolic panel Lab Results  Component  Value Date   GLUCOSE 151 (H) 08/16/2022   NA 143 08/16/2022   K 4.3 08/16/2022   CL 106 08/16/2022   CO2 25 08/16/2022   BUN 21 08/16/2022   CREATININE 1.38 (H) 08/16/2022   GFRNONAA 59 (L) 04/04/2022   CALCIUM 9.6 08/16/2022   PROT 6.2 (L) 04/04/2022   ALBUMIN 3.5 04/04/2022   BILITOT 1.0 04/04/2022   ALKPHOS 51 04/04/2022   AST 16 04/04/2022   ALT 13 04/04/2022   ANIONGAP 4 (L) 04/04/2022   Last lipids Lab Results  Component Value Date   CHOL 131 04/04/2022   HDL 43 04/04/2022   LDLCALC 78 04/04/2022   TRIG 49 04/04/2022   CHOLHDL 3.0 04/04/2022   Last hemoglobin A1c Lab Results  Component Value Date   HGBA1C 5.1 04/03/2022   Last thyroid functions Lab Results  Component Value Date   TSH 3.567 04/04/2022   Last vitamin B12 and Folate Lab Results  Component Value Date   VITAMINB12 406 07/23/2022   FOLATE 14.1 07/23/2022     Assessment & Plan:   Problem List Items Addressed This Visit       Essential hypertension - Primary    Presenting today for HTN follow-up.  Olmesartan 20 mg daily was started at his last appointment.  His blood pressure today is 129/72. -No medication changes today.  BP is adequately controlled. -BMP ordered today      Anxiety    Currently well-controlled with hydroxyzine 25 mg as needed.       Return in about 3 months (around 11/14/2022).    Johnette Abraham, MD

## 2022-08-17 LAB — BASIC METABOLIC PANEL
BUN/Creatinine Ratio: 15 (ref 10–24)
BUN: 21 mg/dL (ref 8–27)
CO2: 25 mmol/L (ref 20–29)
Calcium: 9.6 mg/dL (ref 8.6–10.2)
Chloride: 106 mmol/L (ref 96–106)
Creatinine, Ser: 1.38 mg/dL — ABNORMAL HIGH (ref 0.76–1.27)
Glucose: 151 mg/dL — ABNORMAL HIGH (ref 70–99)
Potassium: 4.3 mmol/L (ref 3.5–5.2)
Sodium: 143 mmol/L (ref 134–144)
eGFR: 51 mL/min/{1.73_m2} — ABNORMAL LOW (ref >59)

## 2022-08-19 ENCOUNTER — Telehealth: Payer: Self-pay | Admitting: Internal Medicine

## 2022-08-19 NOTE — Assessment & Plan Note (Addendum)
Currently well-controlled with hydroxyzine 25 mg as needed.

## 2022-08-19 NOTE — Assessment & Plan Note (Signed)
Presenting today for HTN follow-up.  Olmesartan 20 mg daily was started at his last appointment.  His blood pressure today is 129/72. -No medication changes today.  BP is adequately controlled. -BMP ordered today

## 2022-08-19 NOTE — Telephone Encounter (Signed)
Patient returning lab results call 

## 2022-09-16 ENCOUNTER — Encounter: Payer: Self-pay | Admitting: Diagnostic Neuroimaging

## 2022-09-16 ENCOUNTER — Ambulatory Visit: Payer: Medicare Other | Admitting: Diagnostic Neuroimaging

## 2022-09-16 VITALS — BP 155/99 | HR 85 | Ht 67.0 in | Wt 194.8 lb

## 2022-09-16 DIAGNOSIS — H02401 Unspecified ptosis of right eyelid: Secondary | ICD-10-CM

## 2022-09-16 DIAGNOSIS — G459 Transient cerebral ischemic attack, unspecified: Secondary | ICD-10-CM

## 2022-09-16 DIAGNOSIS — H532 Diplopia: Secondary | ICD-10-CM

## 2022-09-16 NOTE — Patient Instructions (Signed)
TIA (transient ischemic attack) - right face / arm numbness x 10 minutes - continue aspirin 81mg , atorvastatin, BP control  RIGHT PTOSIS (fluctuating; with intermittent double vision, in Oct 2023) - check myasthenia lab testing
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
## 2022-09-19 ENCOUNTER — Encounter: Payer: Self-pay | Admitting: Family Medicine

## 2022-09-19 ENCOUNTER — Ambulatory Visit (INDEPENDENT_AMBULATORY_CARE_PROVIDER_SITE_OTHER): Payer: Medicare Other | Admitting: Family Medicine

## 2022-09-19 VITALS — Ht 67.0 in | Wt 195.0 lb

## 2022-09-19 DIAGNOSIS — Z0100 Encounter for examination of eyes and vision without abnormal findings: Secondary | ICD-10-CM

## 2022-09-19 DIAGNOSIS — Z0000 Encounter for general adult medical examination without abnormal findings: Secondary | ICD-10-CM

## 2022-09-19 NOTE — Progress Notes (Signed)
Subjective:   Cameron Alexander is a 82 y.o. male who presents for Medicare Annual/Subsequent preventive examination.  Review of Systems    Patient denies pain, fever, chills, chest pain, palpations ,shortness of breath, blurred vision,cough, abdominal pain, nausea, vomiting, headache, dizziness. Patient is not feeling nervous or anxious.  Cardiac Risk Factors include: advanced age (>36men, >12 women)     Objective:    Today's Vitals   09/19/22 0854  Weight: 195 lb (88.5 kg)  Height: 5\' 7"  (1.702 m)   Body mass index is 30.54 kg/m.     09/19/2022    8:58 AM 04/03/2022    6:40 PM 04/03/2022    5:41 PM 01/31/2016   12:41 PM 01/12/2015   10:06 AM 01/07/2015   11:19 PM 07/27/2014    5:51 PM  Advanced Directives  Does Patient Have a Medical Advance Directive? No;Yes No No No Yes No No  Type of Advance Directive     [place]    Does patient want to make changes to medical advance directive? Yes (Inpatient - patient defers changing a medical advance directive at this time - Information given)        Would patient like information on creating a medical advance directive? Yes (Inpatient - patient defers creating a medical advance directive at this time - Information given) No - Patient declined  No - patient declined information   No - patient declined information    Current Medications (verified) Outpatient Encounter Medications as of 09/19/2022  Medication Sig   aspirin 81 MG chewable tablet Chew 1 tablet (81 mg total) by mouth daily.   atorvastatin (LIPITOR) 40 MG tablet Take 1 tablet (40 mg total) by mouth at bedtime.   cyanocobalamin (VITAMIN B12) 500 MCG tablet Take 500 mcg by mouth daily.   hydrOXYzine (VISTARIL) 25 MG capsule Take 1 capsule (25 mg total) by mouth every 8 (eight) hours as needed for anxiety.   olmesartan (BENICAR) 20 MG tablet Take 1 tablet (20 mg total) by mouth daily.   pantoprazole (PROTONIX) 40 MG tablet Take 1 tablet (40 mg total) by mouth  daily.   No facility-administered encounter medications on file as of 09/19/2022.    Allergies (verified) Patient has no known allergies.   History: No past medical history on file. Past Surgical History:  Procedure Laterality Date   ESOPHAGOGASTRODUODENOSCOPY ENDOSCOPY     No family history on file. Social History   Socioeconomic History   Marital status: Married    Spouse name: Not on file   Number of children: Not on file   Years of education: Not on file   Highest education level: Not on file  Occupational History   Not on file  Tobacco Use   Smoking status: Never   Smokeless tobacco: Current  Substance and Sexual Activity   Alcohol use: Yes    Alcohol/week: 2.0 standard drinks of alcohol    Types: 2 Cans of beer per week    Comment: occasional   Drug use: No   Sexual activity: Not on file  Other Topics Concern   Not on file  Social History Narrative   Not on file   Social Determinants of Health   Financial Resource Strain: Low Risk  (09/19/2022)   Overall Financial Resource Strain (CARDIA)    Difficulty of Paying Living Expenses: Not hard at all  Food Insecurity: No Food Insecurity (09/19/2022)   Hunger Vital Sign    Worried About Running Out of Food  in the Last Year: Never true    [place] in the Last Year: Never true  Transportation Needs: No Transportation Needs (09/19/2022)   PRAPARE - Hydrologist (Medical): No    Lack of Transportation (Non-Medical): No  Physical Activity: Sufficiently Active (09/19/2022)   Exercise Vital Sign    Days of Exercise per Week: 5 days    Minutes of Exercise per Session: 60 min  Stress: No Stress Concern Present (09/19/2022)   [place]    Feeling of Stress : Not at all  Social Connections: Moderately Integrated (09/19/2022)   Social Connection and Isolation Panel [NHANES]    Frequency of Communication with Friends and  Family: More than three times a week    Frequency of Social Gatherings with Friends and Family: More than three times a week    Attends Religious Services: More than 4 times per year    Active Member of [employer] or Organizations: No    Attends Music therapist: Never    Marital Status: Married    Tobacco Counseling Ready to quit: Not Answered Counseling given: Not Answered   Clinical Intake:  Pre-visit preparation completed: No  Pain : No/denies pain     BMI - recorded: 30.54 Nutritional Status: BMI > 30  Obese Diabetes: No  How often do you need to have someone help you when you read instructions, pamphlets, or other written materials from your doctor or pharmacy?: 1 - Never  Diabetic? No Hemoglobin A1c 5.1  Interpreter Needed?: No      Activities of Daily Living    09/19/2022    8:59 AM 04/05/2022    6:00 AM  In your present state of health, do you have any difficulty performing the following activities:  Hearing? 0 0  Vision? 0 0  Difficulty concentrating or making decisions? 0 0  Walking or climbing stairs? 0 0  Dressing or bathing? 0 0  Doing errands, shopping? 0 0  Preparing Food and eating ? N   Using the Toilet? N   In the past six months, have you accidently leaked urine? N   Do you have problems with loss of bowel control? N   Managing your Medications? N   Managing your Finances? N   Housekeeping or managing your Housekeeping? N     Patient Care Team: Johnette Abraham, MD as PCP - General (Internal Medicine)  Indicate any recent Medical Services you may have received from other than [facility] providers in the past year (date may be approximate).     Assessment:   This is a routine wellness examination for [place].  Hearing/Vision screen No results found.  Dietary issues and exercise activities discussed:     Goals Addressed   None    Depression Screen    09/19/2022    8:57 AM 08/16/2022   10:45 AM 07/23/2022   10:27 AM  04/15/2022    9:54 AM  PHQ 2/9 Scores  PHQ - 2 Score 0 0 0 1  PHQ- 9 Score  0 0     Fall Risk    09/19/2022    8:59 AM 08/16/2022   10:45 AM 07/23/2022   10:27 AM 04/15/2022    9:54 AM 01/21/2019    6:28 PM  Fall Risk   Falls in the past year? 0 0 0 0   Comment     Emmi Telephone Survey: data to providers prior  to load  Number falls in past yr: 0 0 0 0   Comment     Emmi Telephone Survey Actual Response =   Injury with Fall? 0 0 0 0   Risk for fall due to : No Fall Risks No Fall Risks No Fall Risks No Fall Risks   Follow up Falls evaluation completed Falls evaluation completed Falls evaluation completed Falls evaluation completed     [place]:  Any stairs in or around the home? Yes  If so, are there any without handrails? No  Home free of loose throw rugs in walkways, pet beds, electrical cords, etc? Yes  Adequate lighting in your home to reduce risk of falls? Yes   ASSISTIVE DEVICES UTILIZED TO PREVENT FALLS:  Life alert? No  Use of a cane, walker or w/c? No  Grab bars in the bathroom? Yes  Shower chair or bench in shower? No  Elevated toilet seat or a handicapped toilet? No           09/19/2022    8:59 AM  6CIT Screen  What Year? 0 points  What month? 0 points  What time? 0 points  Count back from 20 0 points  Months in reverse 4 points  Repeat phrase 0 points  Total Score 4 points    Immunizations Immunization History  Administered Date(s) Administered   Fluad Quad(high Dose 65+) 04/15/2022   Moderna Sars-Covid-2 Vaccination 09/02/2019, 10/02/2019, 07/11/2020, 03/06/2021   PNEUMOCOCCAL CONJUGATE-20 04/04/2021   Tdap 02/23/2011, 07/27/2014    TDAP status: Up to date  Flu Vaccine status: Up to date  Pneumococcal vaccine status: Up to date  Covid-19 vaccine status: Information provided on how to obtain vaccines.   Qualifies for Shingles Vaccine? Yes   Zostavax completed No   Shingrix Completed?: No.    Education  has been provided regarding the importance of this vaccine. Patient has been advised to call insurance company to determine out of pocket expense if they have not yet received this vaccine. Advised may also receive vaccine at local pharmacy or Health Dept. Verbalized acceptance and understanding.  Screening Tests Health Maintenance  Topic Date Due   Medicare Annual Wellness (AWV)  Never done   Zoster Vaccines- Shingrix (1 of 2) Never done   COVID-19 Vaccine (5 - 2023-24 season) 10/05/2022 (Originally 02/22/2022)   DTaP/Tdap/Td (3 - Td or Tdap) 07/27/2024   Pneumonia Vaccine 50+ Years old  Completed   INFLUENZA VACCINE  Completed   HPV VACCINES  Aged Out    Health Maintenance  Health Maintenance Due  Topic Date Due   Medicare Annual Wellness (AWV)  Never done   Zoster Vaccines- Shingrix (1 of 2) Never done    Colorectal cancer screening: No longer required.   Lung Cancer Screening: (Low Dose CT Chest recommended if Age 68-80 years, 30 pack-year currently smoking OR have quit w/in 15years.) does not qualify.   Lung Cancer Screening Referral: N/A  Additional Screening:  Hepatitis C Screening: does qualify; Not Completed   Vision Screening: Recommended annual ophthalmology exams for early detection of glaucoma and other disorders of the eye. Is the patient up to date with their annual eye exam?  No  Who is the provider or what is the name of the office in which the patient attends annual eye exams? Patient does not want to go "myeyeDR and wants to referral  If pt is not established with a provider, would they like to be referred to  a provider to establish care? No .   Dental Screening: Recommended annual dental exams for proper oral hygiene  Community Resource Referral / Chronic Care Management: CRR required this visit?  No   CCM required this visit?  No      Plan:     I have personally reviewed and noted the following in the patient's chart:   Medical and social  history Use of alcohol, tobacco or illicit drugs  Current medications and supplements including opioid prescriptions. Patient is not currently taking opioid prescriptions. Functional ability and status Nutritional status Physical activity Advanced directives List of other physicians Hospitalizations, surgeries, and ER visits in previous 12 months Vitals Screenings to include cognitive, depression, and falls Referrals and appointments  In addition, I have reviewed and discussed with patient certain preventive protocols, quality metrics, and best practice recommendations. A written personalized care plan for preventive services as well as general preventive health recommendations were provided to patient.     [place], [place]   09/19/2022

## 2022-10-18 ENCOUNTER — Other Ambulatory Visit: Payer: Self-pay | Admitting: Internal Medicine

## 2022-10-18 DIAGNOSIS — I10 Essential (primary) hypertension: Secondary | ICD-10-CM

## 2022-11-15 ENCOUNTER — Ambulatory Visit (INDEPENDENT_AMBULATORY_CARE_PROVIDER_SITE_OTHER): Payer: Medicare Other | Admitting: Internal Medicine

## 2022-11-15 ENCOUNTER — Encounter: Payer: Self-pay | Admitting: Internal Medicine

## 2022-11-15 VITALS — BP 105/61 | HR 88 | Ht 67.0 in | Wt 197.6 lb

## 2022-11-15 DIAGNOSIS — I10 Essential (primary) hypertension: Secondary | ICD-10-CM | POA: Diagnosis not present

## 2022-11-15 DIAGNOSIS — K219 Gastro-esophageal reflux disease without esophagitis: Secondary | ICD-10-CM

## 2022-11-15 DIAGNOSIS — H02401 Unspecified ptosis of right eyelid: Secondary | ICD-10-CM | POA: Diagnosis not present

## 2022-11-15 DIAGNOSIS — D696 Thrombocytopenia, unspecified: Secondary | ICD-10-CM | POA: Diagnosis not present

## 2022-11-15 DIAGNOSIS — G459 Transient cerebral ischemic attack, unspecified: Secondary | ICD-10-CM

## 2022-11-15 NOTE — Progress Notes (Unsigned)
Established Patient Office Visit  Subjective   Patient ID: Cameron Alexander, male    DOB: 1940/10/12  Age: 82 y.o. MRN: [id number]  Chief Complaint  Patient presents with   Hypertension    Follow up   Mr. Cameron Alexander returns to care today for routine follow-up.  Last evaluated by me on 2/23 for HTN follow-up.  No medication changes were made at that time and 74-month follow-up was arranged.  In the interim, he was evaluated by neurology on 3/25 in the setting of prior TIA.  He was also noted to have double vision and ptosis of the right eye.  AChR antibody ordered but does not appear to have been completed.  There have otherwise been no acute interval events.  Mr. Cameron Alexander reports feeling well today.  He is asymptomatic and has no acute concerns to discuss.  History reviewed. No pertinent past medical history. Past Surgical History:  Procedure Laterality Date   ESOPHAGOGASTRODUODENOSCOPY ENDOSCOPY     Social History   Tobacco Use   Smoking status: Never   Smokeless tobacco: Current  Substance Use Topics   Alcohol use: Yes    Alcohol/week: 2.0 standard drinks of alcohol    Types: 2 Cans of beer per week    Comment: occasional   Drug use: No   History reviewed. No pertinent family history. No Known Allergies    Review of Systems  Constitutional:  Negative for chills and fever.  HENT:  Negative for sore throat.   Respiratory:  Negative for cough and shortness of breath.   Cardiovascular:  Negative for chest pain, palpitations and leg swelling.  Gastrointestinal:  Negative for abdominal pain, blood in stool, constipation, diarrhea, nausea and vomiting.  Genitourinary:  Negative for dysuria and hematuria.  Musculoskeletal:  Negative for myalgias.  Skin:  Negative for itching and rash.  Neurological:  Negative for dizziness and headaches.  Psychiatric/Behavioral:  Negative for depression and suicidal ideas.      Objective:     BP 105/61   Pulse 88   Ht 5\' 7"  (1.702 m)   Wt 197  lb 9.6 oz (89.6 kg)   SpO2 96%   BMI 30.95 kg/m  BP Readings from Last 3 Encounters:  11/15/22 105/61  09/16/22 (!) 155/99  08/16/22 129/72   Physical Exam Vitals reviewed.  Constitutional:      General: He is not in acute distress.    Appearance: Normal appearance. He is not ill-appearing.  HENT:     Head: Normocephalic and atraumatic.     Nose: Nose normal. No congestion or rhinorrhea.     Mouth/Throat:     Mouth: Mucous membranes are moist.     Pharynx: Oropharynx is clear.  Eyes:     Extraocular Movements: Extraocular movements intact.     Conjunctiva/sclera: Conjunctivae normal.     Pupils: Pupils are equal, round, and reactive to light.  Cardiovascular:     Rate and Rhythm: Normal rate and regular rhythm.     Pulses: Normal pulses.     Heart sounds: Normal heart sounds. No murmur heard. Pulmonary:     Effort: Pulmonary effort is normal.     Breath sounds: Normal breath sounds. No wheezing, rhonchi or rales.  Abdominal:     General: Abdomen is flat. Bowel sounds are normal. There is no distension.     Palpations: Abdomen is soft.     Tenderness: There is no abdominal tenderness.  Musculoskeletal:        General: No  swelling or deformity. Normal range of motion.     Cervical back: Normal range of motion.  Skin:    General: Skin is warm and dry.     Capillary Refill: Capillary refill takes less than 2 seconds.  Neurological:     General: No focal deficit present.     Mental Status: He is alert and oriented to person, place, and time.     Cranial Nerves: No cranial nerve deficit.     Sensory: No sensory deficit.     Motor: No weakness.     Coordination: Coordination normal.     Gait: Gait normal.  Psychiatric:        Mood and Affect: Mood normal.        Behavior: Behavior normal.        Thought Content: Thought content normal.   Last CBC Lab Results  Component Value Date   WBC 5.5 04/04/2022   HGB 13.0 04/04/2022   HCT 38.3 (L) 04/04/2022   MCV 89.7  04/04/2022   MCH 30.4 04/04/2022   RDW 14.6 04/04/2022   PLT 143 (L) 04/04/2022   Last metabolic panel Lab Results  Component Value Date   GLUCOSE 151 (H) 08/16/2022   NA 143 08/16/2022   K 4.3 08/16/2022   CL 106 08/16/2022   CO2 25 08/16/2022   BUN 21 08/16/2022   CREATININE 1.38 (H) 08/16/2022   EGFR 51 (L) 08/16/2022   CALCIUM 9.6 08/16/2022   PROT 6.2 (L) 04/04/2022   ALBUMIN 3.5 04/04/2022   BILITOT 1.0 04/04/2022   ALKPHOS 51 04/04/2022   AST 16 04/04/2022   ALT 13 04/04/2022   ANIONGAP 4 (L) 04/04/2022   Last lipids Lab Results  Component Value Date   CHOL 131 04/04/2022   HDL 43 04/04/2022   LDLCALC 78 04/04/2022   TRIG 49 04/04/2022   CHOLHDL 3.0 04/04/2022   Last hemoglobin A1c Lab Results  Component Value Date   HGBA1C 5.1 04/03/2022   Last thyroid functions Lab Results  Component Value Date   TSH 3.567 04/04/2022   Last vitamin B12 and Folate Lab Results  Component Value Date   VITAMINB12 406 07/23/2022   FOLATE 14.1 07/23/2022     Assessment & Plan:   Problem List Items Addressed This Visit       TIA (transient ischemic attack)    History of TIA in October 2023.  Recently evaluated by neurology on 3/25.  Recommended continued ASA/statin therapy. -No medication changes today.  Continue ASA 81 mg daily and atorvastatin 40 mg daily.      Essential hypertension    BP remains well-controlled on olmesartan 20 mg daily. -No medication changes today      Gastroesophageal reflux disease    Symptoms remain well-controlled with Protonix 40 mg daily.      Thrombocytopenia (HCC) - Primary    Platelet count 143 on labs from October 2023. -Repeat CBC ordered today      Ptosis of right eyelid    Double vision and ptosis of the right eyelid noted during neurology evaluation in March. AChR antibody ordered but does not appear to have been completed.  Patient reports that symptoms have improved. -Will follow-up with neurology on the status of  previously ordered lab      Return in about 6 months (around 05/18/2023).   Billie Lade, MD

## 2022-11-15 NOTE — Patient Instructions (Signed)
It was a pleasure to see you today.  Thank you for giving [place] the opportunity to be involved in your care.  Below is a brief recap of your visit and next steps.  We will plan to see you again in 6 months.  Summary No medication changes today Repeat labs next week Follow up in 6 months

## 2022-11-21 DIAGNOSIS — D696 Thrombocytopenia, unspecified: Secondary | ICD-10-CM | POA: Insufficient documentation

## 2022-11-21 DIAGNOSIS — I10 Essential (primary) hypertension: Secondary | ICD-10-CM | POA: Diagnosis not present

## 2022-11-21 DIAGNOSIS — H02401 Unspecified ptosis of right eyelid: Secondary | ICD-10-CM | POA: Insufficient documentation

## 2022-11-21 NOTE — Assessment & Plan Note (Signed)
History of TIA in October 2023.  Recently evaluated by neurology on 3/25.  Recommended continued ASA/statin therapy. -No medication changes today.  Continue ASA 81 mg daily and atorvastatin 40 mg daily.

## 2022-11-21 NOTE — Assessment & Plan Note (Signed)
Platelet count 143 on labs from October 2023. -Repeat CBC ordered today

## 2022-11-21 NOTE — Assessment & Plan Note (Signed)
Symptoms remain well-controlled with Protonix 40 mg daily.

## 2022-11-21 NOTE — Assessment & Plan Note (Signed)
Double vision and ptosis of the right eyelid noted during neurology evaluation in March. AChR antibody ordered but does not appear to have been completed.  Patient reports that symptoms have improved. -Will follow-up with neurology on the status of previously ordered lab

## 2022-11-21 NOTE — Assessment & Plan Note (Signed)
BP remains well-controlled on olmesartan 20 mg daily. -No medication changes today

## 2022-11-22 LAB — CBC WITH DIFFERENTIAL/PLATELET
Basophils Absolute: 0 10*3/uL (ref 0.0–0.2)
Basos: 1 %
EOS (ABSOLUTE): 0.4 10*3/uL (ref 0.0–0.4)
Eos: 5 %
Hematocrit: 39.7 % (ref 37.5–51.0)
Hemoglobin: 13.1 g/dL (ref 13.0–17.7)
Immature Grans (Abs): 0 10*3/uL (ref 0.0–0.1)
Immature Granulocytes: 1 %
Lymphocytes Absolute: 1.4 10*3/uL (ref 0.7–3.1)
Lymphs: 21 %
MCH: 29.6 pg (ref 26.6–33.0)
MCHC: 33 g/dL (ref 31.5–35.7)
MCV: 90 fL (ref 79–97)
Monocytes Absolute: 0.5 10*3/uL (ref 0.1–0.9)
Monocytes: 7 %
Neutrophils Absolute: 4.3 10*3/uL (ref 1.4–7.0)
Neutrophils: 65 %
Platelets: 170 10*3/uL (ref 150–450)
RBC: 4.42 x10E6/uL (ref 4.14–5.80)
RDW: 13.5 % (ref 11.6–15.4)
WBC: 6.6 10*3/uL (ref 3.4–10.8)

## 2022-11-22 LAB — CMP14+EGFR
ALT: 13 IU/L (ref 0–44)
AST: 16 IU/L (ref 0–40)
Albumin/Globulin Ratio: 1.6 (ref 1.2–2.2)
Albumin: 4.1 g/dL (ref 3.7–4.7)
Alkaline Phosphatase: 96 IU/L (ref 44–121)
BUN/Creatinine Ratio: 11 (ref 10–24)
BUN: 16 mg/dL (ref 8–27)
Bilirubin Total: 0.3 mg/dL (ref 0.0–1.2)
CO2: 22 mmol/L (ref 20–29)
Calcium: 9.3 mg/dL (ref 8.6–10.2)
Chloride: 107 mmol/L — ABNORMAL HIGH (ref 96–106)
Creatinine, Ser: 1.4 mg/dL — ABNORMAL HIGH (ref 0.76–1.27)
Globulin, Total: 2.5 g/dL (ref 1.5–4.5)
Glucose: 82 mg/dL (ref 70–99)
Potassium: 4.5 mmol/L (ref 3.5–5.2)
Sodium: 142 mmol/L (ref 134–144)
Total Protein: 6.6 g/dL (ref 6.0–8.5)
eGFR: 50 mL/min/{1.73_m2} — ABNORMAL LOW (ref >59)

## 2023-01-19 ENCOUNTER — Other Ambulatory Visit: Payer: Self-pay | Admitting: Internal Medicine

## 2023-01-19 DIAGNOSIS — I10 Essential (primary) hypertension: Secondary | ICD-10-CM

## 2023-01-19 DIAGNOSIS — K219 Gastro-esophageal reflux disease without esophagitis: Secondary | ICD-10-CM

## 2023-01-19 DIAGNOSIS — E782 Mixed hyperlipidemia: Secondary | ICD-10-CM

## 2023-04-21 ENCOUNTER — Other Ambulatory Visit: Payer: Self-pay | Admitting: Internal Medicine

## 2023-04-21 DIAGNOSIS — I10 Essential (primary) hypertension: Secondary | ICD-10-CM

## 2023-05-19 ENCOUNTER — Encounter: Payer: Self-pay | Admitting: Internal Medicine

## 2023-05-19 ENCOUNTER — Ambulatory Visit (INDEPENDENT_AMBULATORY_CARE_PROVIDER_SITE_OTHER): Payer: Medicare Other | Admitting: Internal Medicine

## 2023-05-19 VITALS — BP 138/78 | HR 87 | Ht 68.0 in | Wt 207.0 lb

## 2023-05-19 DIAGNOSIS — E538 Deficiency of other specified B group vitamins: Secondary | ICD-10-CM | POA: Diagnosis not present

## 2023-05-19 DIAGNOSIS — K219 Gastro-esophageal reflux disease without esophagitis: Secondary | ICD-10-CM | POA: Diagnosis not present

## 2023-05-19 DIAGNOSIS — I10 Essential (primary) hypertension: Secondary | ICD-10-CM | POA: Diagnosis not present

## 2023-05-19 DIAGNOSIS — H532 Diplopia: Secondary | ICD-10-CM

## 2023-05-19 DIAGNOSIS — Z23 Encounter for immunization: Secondary | ICD-10-CM | POA: Diagnosis not present

## 2023-05-19 DIAGNOSIS — Z131 Encounter for screening for diabetes mellitus: Secondary | ICD-10-CM

## 2023-05-19 DIAGNOSIS — E782 Mixed hyperlipidemia: Secondary | ICD-10-CM | POA: Diagnosis not present

## 2023-05-19 DIAGNOSIS — Z1329 Encounter for screening for other suspected endocrine disorder: Secondary | ICD-10-CM

## 2023-05-19 DIAGNOSIS — D696 Thrombocytopenia, unspecified: Secondary | ICD-10-CM

## 2023-05-19 DIAGNOSIS — H02401 Unspecified ptosis of right eyelid: Secondary | ICD-10-CM

## 2023-05-19 DIAGNOSIS — E66811 Obesity, class 1: Secondary | ICD-10-CM

## 2023-05-19 DIAGNOSIS — F419 Anxiety disorder, unspecified: Secondary | ICD-10-CM | POA: Diagnosis not present

## 2023-05-19 MED ORDER — HYDROXYZINE HCL 10 MG PO TABS: 10.0000 mg | ORAL_TABLET | Freq: Three times a day (TID) | ORAL | 0 refills | Status: AC | PRN

## 2023-05-19 NOTE — Assessment & Plan Note (Signed)
Symptoms remain well-controlled with Protonix 40 mg daily.  No medication changes are indicated today.

## 2023-05-19 NOTE — Assessment & Plan Note (Signed)
Hydroxyzine 25 mg as needed for anxiety relief previously prescribed.  Today he states that it is drying out his mouth.  He has tried taking a pill prescribed to his wife for anxiety relief and states that it was effective, however he cannot recall the name of the medication. -Reduce hydroxyzine to 10 mg daily as needed for anxiety relief

## 2023-05-19 NOTE — Assessment & Plan Note (Signed)
Stable on labs from May.  Repeat CBC ordered today.

## 2023-05-19 NOTE — Assessment & Plan Note (Signed)
Remains adequately controlled with olmesartan 20 mg daily.  No medication changes are indicated today.

## 2023-05-19 NOTE — Assessment & Plan Note (Signed)
Influenza vaccine administered today.

## 2023-05-19 NOTE — Patient Instructions (Signed)
It was a pleasure to see you today.  Thank you for giving [place] the opportunity to be involved in your care.  Below is a brief recap of your visit and next steps.  We will plan to see you again in 6 months.  Summary Reduce hydroxyzine to 10 mg as needed for anxiety relief Repeat labs ordered Ophthalmology referral placed Follow up in 6 months

## 2023-05-19 NOTE — Assessment & Plan Note (Signed)
He continues to endorse double vision of the right eye.  Ptosis of the right upper eyelid noted on exam today.  Describes previously undergoing bilateral blepharoplasty.  He has attempted to follow-up with his ophthalmologist, but states that they are no longer operating. -New ophthalmology referral placed today

## 2023-05-19 NOTE — Progress Notes (Signed)
Established Patient Office Visit  Subjective   Patient ID: Cameron Alexander, male    DOB: 06-Mar-1941  Age: 82 y.o. MRN: [id number]  Chief Complaint  Patient presents with   Hypertension    Six month follow up    Anxiety    Patient states he has noticed he's getting nervous a lot more   Cameron Alexander returns to care today for routine follow-up.  He was last evaluated by me on 5/24.  No medication changes were made at that time and 75-month follow-up was arranged.  There have been no acute interval events.  Cameron Alexander reports feeling fairly well today.  His acute concern is anxiety.  He tried taking hydroxyzine 25 mg as previously prescribed for as needed anxiety relief, but states that it dried his mouth out.  He tried taking a pill prescribed to his wife and stated that it was effective but he cannot recall the name of this medication.  He is interested in additional treatment options.  His other concern is ptosis in his right eye.  Symptoms are largely unchanged and it is affecting his ability to drive.  He describes previously undergoing bilateral blepharoplasty.  His ophthalmologist is no longer operating.  History reviewed. No pertinent past medical history. Past Surgical History:  Procedure Laterality Date   ESOPHAGOGASTRODUODENOSCOPY ENDOSCOPY     Social History   Tobacco Use   Smoking status: Never   Smokeless tobacco: Current  Substance Use Topics   Alcohol use: Yes    Alcohol/week: 2.0 standard drinks of alcohol    Types: 2 Cans of beer per week    Comment: occasional   Drug use: No   History reviewed. No pertinent family history. No Known Allergies  Review of Systems  Constitutional:  Negative for chills and fever.  HENT:  Negative for sore throat.   Eyes:  Positive for double vision (Right eye).  Respiratory:  Negative for cough and shortness of breath.   Cardiovascular:  Negative for chest pain, palpitations and leg swelling.  Gastrointestinal:  Negative for abdominal  pain, blood in stool, constipation, diarrhea, nausea and vomiting.  Genitourinary:  Negative for dysuria and hematuria.  Musculoskeletal:  Negative for myalgias.  Skin:  Negative for itching and rash.  Neurological:  Negative for dizziness and headaches.  Psychiatric/Behavioral:  Negative for depression and suicidal ideas. The patient is nervous/anxious.      Objective:     BP 138/78   Pulse 87   Ht 5\' 8"  (1.727 m)   Wt 207 lb (93.9 kg)   SpO2 95%   BMI 31.47 kg/m  BP Readings from Last 3 Encounters:  05/19/23 138/78  11/15/22 105/61  09/16/22 (!) 155/99   Physical Exam Vitals reviewed.  Constitutional:      General: He is not in acute distress.    Appearance: Normal appearance. He is not ill-appearing.  HENT:     Head: Normocephalic and atraumatic.     Nose: Nose normal. No congestion or rhinorrhea.     Mouth/Throat:     Mouth: Mucous membranes are moist.     Pharynx: Oropharynx is clear.  Eyes:     Extraocular Movements: Extraocular movements intact.     Conjunctiva/sclera: Conjunctivae normal.     Pupils: Pupils are equal, round, and reactive to light.     Comments: Ptosis of right upper eyelid  Cardiovascular:     Rate and Rhythm: Normal rate and regular rhythm.     Pulses: Normal pulses.  Heart sounds: Normal heart sounds. No murmur heard. Pulmonary:     Effort: Pulmonary effort is normal.     Breath sounds: Normal breath sounds. No wheezing, rhonchi or rales.  Abdominal:     General: Abdomen is flat. Bowel sounds are normal. There is no distension.     Palpations: Abdomen is soft.     Tenderness: There is no abdominal tenderness.  Musculoskeletal:        General: No swelling or deformity. Normal range of motion.     Cervical back: Normal range of motion.  Skin:    General: Skin is warm and dry.     Capillary Refill: Capillary refill takes less than 2 seconds.  Neurological:     General: No focal deficit present.     Mental Status: He is alert and  oriented to person, place, and time.     Cranial Nerves: No cranial nerve deficit.     Sensory: No sensory deficit.     Motor: No weakness.     Coordination: Coordination normal.     Gait: Gait normal.  Psychiatric:        Mood and Affect: Mood normal.        Behavior: Behavior normal.        Thought Content: Thought content normal.   Last CBC Lab Results  Component Value Date   WBC 6.6 11/21/2022   HGB 13.1 11/21/2022   HCT 39.7 11/21/2022   MCV 90 11/21/2022   MCH 29.6 11/21/2022   RDW 13.5 11/21/2022   PLT 170 11/21/2022   Last metabolic panel Lab Results  Component Value Date   GLUCOSE 82 11/21/2022   NA 142 11/21/2022   K 4.5 11/21/2022   CL 107 (H) 11/21/2022   CO2 22 11/21/2022   BUN 16 11/21/2022   CREATININE 1.40 (H) 11/21/2022   EGFR 50 (L) 11/21/2022   CALCIUM 9.3 11/21/2022   PROT 6.6 11/21/2022   ALBUMIN 4.1 11/21/2022   LABGLOB 2.5 11/21/2022   AGRATIO 1.6 11/21/2022   BILITOT 0.3 11/21/2022   ALKPHOS 96 11/21/2022   AST 16 11/21/2022   ALT 13 11/21/2022   ANIONGAP 4 (L) 04/04/2022   Last lipids Lab Results  Component Value Date   CHOL 131 04/04/2022   HDL 43 04/04/2022   LDLCALC 78 04/04/2022   TRIG 49 04/04/2022   CHOLHDL 3.0 04/04/2022   Last hemoglobin A1c Lab Results  Component Value Date   HGBA1C 5.1 04/03/2022   Last thyroid functions Lab Results  Component Value Date   TSH 3.567 04/04/2022   Last vitamin B12 and Folate Lab Results  Component Value Date   VITAMINB12 406 07/23/2022   FOLATE 14.1 07/23/2022     Assessment & Plan:   Problem List Items Addressed This Visit       Essential hypertension    Remains adequately controlled with olmesartan 20 mg daily.  No medication changes are indicated today.      Gastroesophageal reflux disease    Symptoms remain well-controlled with Protonix 40 mg daily.  No medication changes are indicated today.      Thrombocytopenia (HCC)    Stable on labs from May.  Repeat CBC  ordered today.      Anxiety - Primary    Hydroxyzine 25 mg as needed for anxiety relief previously prescribed.  Today he states that it is drying out his mouth.  He has tried taking a pill prescribed to his wife for anxiety relief and states that  it was effective, however he cannot recall the name of the medication. -Reduce hydroxyzine to 10 mg daily as needed for anxiety relief      Ptosis of right eyelid    He continues to endorse double vision of the right eye.  Ptosis of the right upper eyelid noted on exam today.  Describes previously undergoing bilateral blepharoplasty.  He has attempted to follow-up with his ophthalmologist, but states that they are no longer operating. -New ophthalmology referral placed today      Need for influenza vaccination    Influenza vaccine administered today      Return in about 6 months (around 11/16/2023).   Billie Lade, MD

## 2023-07-20 ENCOUNTER — Other Ambulatory Visit: Payer: Self-pay | Admitting: Internal Medicine

## 2023-07-20 DIAGNOSIS — K219 Gastro-esophageal reflux disease without esophagitis: Secondary | ICD-10-CM

## 2023-07-20 DIAGNOSIS — E782 Mixed hyperlipidemia: Secondary | ICD-10-CM

## 2023-07-20 DIAGNOSIS — I10 Essential (primary) hypertension: Secondary | ICD-10-CM

## 2023-08-11 DIAGNOSIS — Z131 Encounter for screening for diabetes mellitus: Secondary | ICD-10-CM | POA: Diagnosis not present

## 2023-08-11 DIAGNOSIS — E538 Deficiency of other specified B group vitamins: Secondary | ICD-10-CM | POA: Diagnosis not present

## 2023-08-11 DIAGNOSIS — E782 Mixed hyperlipidemia: Secondary | ICD-10-CM | POA: Diagnosis not present

## 2023-08-11 DIAGNOSIS — K219 Gastro-esophageal reflux disease without esophagitis: Secondary | ICD-10-CM | POA: Diagnosis not present

## 2023-08-11 DIAGNOSIS — E559 Vitamin D deficiency, unspecified: Secondary | ICD-10-CM | POA: Diagnosis not present

## 2023-08-11 DIAGNOSIS — Z1329 Encounter for screening for other suspected endocrine disorder: Secondary | ICD-10-CM | POA: Diagnosis not present

## 2023-08-12 LAB — CBC WITH DIFFERENTIAL/PLATELET
Basophils Absolute: 0 10*3/uL (ref 0.0–0.2)
Basos: 0 %
EOS (ABSOLUTE): 0.4 10*3/uL (ref 0.0–0.4)
Eos: 5 %
Hematocrit: 42.3 % (ref 37.5–51.0)
Hemoglobin: 13.8 g/dL (ref 13.0–17.7)
Immature Grans (Abs): 0 10*3/uL (ref 0.0–0.1)
Immature Granulocytes: 0 %
Lymphocytes Absolute: 1.5 10*3/uL (ref 0.7–3.1)
Lymphs: 22 %
MCH: 29.8 pg (ref 26.6–33.0)
MCHC: 32.6 g/dL (ref 31.5–35.7)
MCV: 91 fL (ref 79–97)
Monocytes Absolute: 0.5 10*3/uL (ref 0.1–0.9)
Monocytes: 8 %
Neutrophils Absolute: 4.3 10*3/uL (ref 1.4–7.0)
Neutrophils: 65 %
Platelets: 194 10*3/uL (ref 150–450)
RBC: 4.63 x10E6/uL (ref 4.14–5.80)
RDW: 13.1 % (ref 11.6–15.4)
WBC: 6.7 10*3/uL (ref 3.4–10.8)

## 2023-08-12 LAB — LIPID PANEL
Chol/HDL Ratio: 2.4 {ratio} (ref 0.0–5.0)
Cholesterol, Total: 106 mg/dL (ref 100–199)
HDL: 44 mg/dL (ref >39)
LDL Chol Calc (NIH): 46 mg/dL (ref 0–99)
Triglycerides: 79 mg/dL (ref 0–149)
VLDL Cholesterol Cal: 16 mg/dL (ref 5–40)

## 2023-08-12 LAB — HEMOGLOBIN A1C
Est. average glucose Bld gHb Est-mCnc: 105 mg/dL
Hgb A1c MFr Bld: 5.3 % (ref 4.8–5.6)

## 2023-08-12 LAB — CMP14+EGFR
ALT: 12 [IU]/L (ref 0–44)
AST: 14 [IU]/L (ref 0–40)
Albumin: 4.3 g/dL (ref 3.7–4.7)
Alkaline Phosphatase: 108 [IU]/L (ref 44–121)
BUN/Creatinine Ratio: 9 — ABNORMAL LOW (ref 10–24)
BUN: 14 mg/dL (ref 8–27)
Bilirubin Total: 0.6 mg/dL (ref 0.0–1.2)
CO2: 23 mmol/L (ref 20–29)
Calcium: 9.4 mg/dL (ref 8.6–10.2)
Chloride: 103 mmol/L (ref 96–106)
Creatinine, Ser: 1.52 mg/dL — ABNORMAL HIGH (ref 0.76–1.27)
Globulin, Total: 2.1 g/dL (ref 1.5–4.5)
Glucose: 104 mg/dL — ABNORMAL HIGH (ref 70–99)
Potassium: 4.4 mmol/L (ref 3.5–5.2)
Sodium: 141 mmol/L (ref 134–144)
Total Protein: 6.4 g/dL (ref 6.0–8.5)
eGFR: 45 mL/min/{1.73_m2} — ABNORMAL LOW (ref >59)

## 2023-08-12 LAB — TSH+FREE T4
Free T4: 1.32 ng/dL (ref 0.82–1.77)
TSH: 4.47 u[IU]/mL (ref 0.450–4.500)

## 2023-08-12 LAB — VITAMIN D 25 HYDROXY (VIT D DEFICIENCY, FRACTURES): Vit D, 25-Hydroxy: 27.7 ng/mL — ABNORMAL LOW (ref 30.0–100.0)

## 2023-08-12 LAB — B12 AND FOLATE PANEL
Folate: 7 ng/mL (ref >3.0)
Vitamin B-12: 649 pg/mL (ref 232–1245)

## 2023-10-06 ENCOUNTER — Ambulatory Visit: Payer: Medicare Other

## 2023-10-20 ENCOUNTER — Other Ambulatory Visit: Payer: Self-pay | Admitting: Internal Medicine

## 2023-10-20 DIAGNOSIS — I10 Essential (primary) hypertension: Secondary | ICD-10-CM

## 2023-11-18 ENCOUNTER — Encounter: Payer: Self-pay | Admitting: Internal Medicine

## 2023-11-18 ENCOUNTER — Ambulatory Visit (INDEPENDENT_AMBULATORY_CARE_PROVIDER_SITE_OTHER): Payer: Medicare Other | Admitting: Internal Medicine

## 2023-11-18 VITALS — BP 128/62 | HR 100 | Ht 63.0 in | Wt 205.0 lb

## 2023-11-18 DIAGNOSIS — K219 Gastro-esophageal reflux disease without esophagitis: Secondary | ICD-10-CM

## 2023-11-18 DIAGNOSIS — N1831 Chronic kidney disease, stage 3a: Secondary | ICD-10-CM | POA: Insufficient documentation

## 2023-11-18 DIAGNOSIS — H02401 Unspecified ptosis of right eyelid: Secondary | ICD-10-CM | POA: Diagnosis not present

## 2023-11-18 DIAGNOSIS — I10 Essential (primary) hypertension: Secondary | ICD-10-CM | POA: Diagnosis not present

## 2023-11-18 DIAGNOSIS — F419 Anxiety disorder, unspecified: Secondary | ICD-10-CM | POA: Diagnosis not present

## 2023-11-18 NOTE — Patient Instructions (Signed)
 It was a pleasure to see you today.  Thank you for giving us  the opportunity to be involved in your care.  Below is a brief recap of your visit and next steps.  We will plan to see you again in 6 months.  Summary No medication changes today Repeat chemistry panel Follow up in 6 months

## 2023-11-18 NOTE — Assessment & Plan Note (Signed)
 Remains adequately controlled on current antihypertensive regimen.  No medication changes are indicated today.

## 2023-11-18 NOTE — Assessment & Plan Note (Signed)
 Symptoms remain well-controlled with pantoprazole

## 2023-11-18 NOTE — Assessment & Plan Note (Addendum)
 Stable on labs from February.  Currently on ARB.  Repeat BMP ordered today.

## 2023-11-18 NOTE — Assessment & Plan Note (Signed)
 Previously referred to ophthalmology in the setting of ptosis of the right upper eyelid.  Will follow-up on the status of this referral today.

## 2023-11-18 NOTE — Assessment & Plan Note (Signed)
 Hydroxyzine  was reduced to 10 mg as needed for anxiety relief at his last appointment.  He reports that this has been effective.  No additional changes are indicated today.

## 2023-11-18 NOTE — Progress Notes (Signed)
 Established Patient Office Visit  Subjective   Patient ID: Cameron Alexander, male    DOB: [birth year]  Age: 83 y.o. MRN: [id number]  Chief Complaint  Patient presents with   Care Management    Six month follow up    Cameron Alexander returns to care today for routine follow-up.  He was last evaluated by me in November 2024.  Hydroxyzine  was reduced to 10 mg daily as needed for anxiety relief, ophthalmology referral placed in the setting of right upper eyelid ptosis, repeat labs ordered, and 60-month follow-up arranged.  There have been no acute interval events.  Today he reports feeling well and has no acute concerns to discuss.  History reviewed. No pertinent past medical history. Past Surgical History:  Procedure Laterality Date   ESOPHAGOGASTRODUODENOSCOPY ENDOSCOPY     Social History   Tobacco Use   Smoking status: Never   Smokeless tobacco: Current  Substance Use Topics   Alcohol use: Yes    Alcohol/week: 2.0 standard drinks of alcohol    Types: 2 Cans of beer per week    Comment: occasional   Drug use: No   History reviewed. No pertinent family history. No Known Allergies  Review of Systems  Constitutional:  Negative for chills and fever.  HENT:  Negative for sore throat.   Respiratory:  Negative for cough and shortness of breath.   Cardiovascular:  Negative for chest pain, palpitations and leg swelling.  Gastrointestinal:  Negative for abdominal pain, blood in stool, constipation, diarrhea, nausea and vomiting.  Genitourinary:  Negative for dysuria and hematuria.  Musculoskeletal:  Negative for myalgias.  Skin:  Negative for itching and rash.  Neurological:  Negative for dizziness and headaches.  Psychiatric/Behavioral:  Negative for depression and suicidal ideas.      Objective:     BP 128/62   Pulse 100   Ht 5\' 3"  (1.6 m)   Wt 205 lb (93 kg)   SpO2 95%   BMI 36.31 kg/m  BP Readings from Last 3 Encounters:  11/18/23 128/62  05/19/23 138/78  11/15/22 105/61    Physical Exam Vitals reviewed.  Constitutional:      General: He is not in acute distress.    Appearance: Normal appearance. He is not ill-appearing.  HENT:     Head: Normocephalic and atraumatic.     Nose: Nose normal. No congestion or rhinorrhea.     Mouth/Throat:     Mouth: Mucous membranes are moist.     Pharynx: Oropharynx is clear.  Eyes:     Extraocular Movements: Extraocular movements intact.     Conjunctiva/sclera: Conjunctivae normal.     Pupils: Pupils are equal, round, and reactive to light.     Comments: Ptosis of right upper eyelid  Cardiovascular:     Rate and Rhythm: Normal rate and regular rhythm.     Pulses: Normal pulses.     Heart sounds: Normal heart sounds. No murmur heard. Pulmonary:     Effort: Pulmonary effort is normal.     Breath sounds: Normal breath sounds. No wheezing, rhonchi or rales.  Abdominal:     General: Abdomen is flat. Bowel sounds are normal. There is no distension.     Palpations: Abdomen is soft.     Tenderness: There is no abdominal tenderness.  Musculoskeletal:        General: No swelling or deformity. Normal range of motion.     Cervical back: Normal range of motion.  Skin:    General: Skin  is warm and dry.     Capillary Refill: Capillary refill takes less than 2 seconds.  Neurological:     General: No focal deficit present.     Mental Status: He is alert and oriented to person, place, and time.     Cranial Nerves: No cranial nerve deficit.     Sensory: No sensory deficit.     Motor: No weakness.     Coordination: Coordination normal.     Gait: Gait normal.  Psychiatric:        Mood and Affect: Mood normal.        Behavior: Behavior normal.        Thought Content: Thought content normal.   Last CBC Lab Results  Component Value Date   WBC 6.7 08/11/2023   HGB 13.8 08/11/2023   HCT 42.3 08/11/2023   MCV 91 08/11/2023   MCH 29.8 08/11/2023   RDW 13.1 08/11/2023   PLT 194 08/11/2023   Last metabolic panel Lab  Results  Component Value Date   GLUCOSE 104 (H) 08/11/2023   NA 141 08/11/2023   K 4.4 08/11/2023   CL 103 08/11/2023   CO2 23 08/11/2023   BUN 14 08/11/2023   CREATININE 1.52 (H) 08/11/2023   EGFR 45 (L) 08/11/2023   CALCIUM  9.4 08/11/2023   PROT 6.4 08/11/2023   ALBUMIN 4.3 08/11/2023   LABGLOB 2.1 08/11/2023   AGRATIO 1.6 11/21/2022   BILITOT 0.6 08/11/2023   ALKPHOS 108 08/11/2023   AST 14 08/11/2023   ALT 12 08/11/2023   ANIONGAP 4 (L) 04/04/2022   Last lipids Lab Results  Component Value Date   CHOL 106 08/11/2023   HDL 44 08/11/2023   LDLCALC 46 08/11/2023   TRIG 79 08/11/2023   CHOLHDL 2.4 08/11/2023   Last hemoglobin A1c Lab Results  Component Value Date   HGBA1C 5.3 08/11/2023   Last thyroid  functions Lab Results  Component Value Date   TSH 4.470 08/11/2023   Last vitamin D  Lab Results  Component Value Date   VD25OH 27.7 (L) 08/11/2023   Last vitamin B12 and Folate Lab Results  Component Value Date   VITAMINB12 649 08/11/2023   FOLATE 7.0 08/11/2023     Assessment & Plan:   Problem List Items Addressed This Visit       Essential hypertension   Remains adequately controlled on current antihypertensive regimen.  No medication changes are indicated today.      Gastroesophageal reflux disease   Symptoms remain well-controlled with pantoprazole .      Chronic kidney disease, stage 3a (HCC)   Stable on labs from February.  Currently on ARB.  Repeat BMP ordered today.      Anxiety   Hydroxyzine  was reduced to 10 mg as needed for anxiety relief at his last appointment.  He reports that this has been effective.  No additional changes are indicated today.      Ptosis of right eyelid   Previously referred to ophthalmology in the setting of ptosis of the right upper eyelid.  Will follow-up on the status of this referral today.      Return in about 6 months (around 05/20/2024) for CPE.   Tobi Fortes, MD

## 2023-11-19 ENCOUNTER — Ambulatory Visit: Payer: Self-pay | Admitting: Internal Medicine

## 2023-11-19 LAB — BASIC METABOLIC PANEL WITH GFR
BUN/Creatinine Ratio: 12 (ref 10–24)
BUN: 17 mg/dL (ref 8–27)
CO2: 23 mmol/L (ref 20–29)
Calcium: 9.7 mg/dL (ref 8.6–10.2)
Chloride: 102 mmol/L (ref 96–106)
Creatinine, Ser: 1.46 mg/dL — ABNORMAL HIGH (ref 0.76–1.27)
Glucose: 131 mg/dL — ABNORMAL HIGH (ref 70–99)
Potassium: 4.2 mmol/L (ref 3.5–5.2)
Sodium: 141 mmol/L (ref 134–144)
eGFR: 47 mL/min/{1.73_m2} — ABNORMAL LOW (ref >59)

## 2024-01-17 ENCOUNTER — Other Ambulatory Visit: Payer: Self-pay | Admitting: Internal Medicine

## 2024-01-17 DIAGNOSIS — K219 Gastro-esophageal reflux disease without esophagitis: Secondary | ICD-10-CM

## 2024-01-17 DIAGNOSIS — E782 Mixed hyperlipidemia: Secondary | ICD-10-CM

## 2024-01-19 ENCOUNTER — Other Ambulatory Visit: Payer: Self-pay | Admitting: Internal Medicine

## 2024-01-19 DIAGNOSIS — I10 Essential (primary) hypertension: Secondary | ICD-10-CM

## 2024-03-04 ENCOUNTER — Ambulatory Visit

## 2024-05-25 ENCOUNTER — Ambulatory Visit (INDEPENDENT_AMBULATORY_CARE_PROVIDER_SITE_OTHER)

## 2024-05-25 VITALS — BP 120/72 | HR 106 | Ht 63.0 in | Wt 198.0 lb

## 2024-05-25 DIAGNOSIS — E66811 Obesity, class 1: Secondary | ICD-10-CM

## 2024-05-25 DIAGNOSIS — E66812 Obesity, class 2: Secondary | ICD-10-CM | POA: Insufficient documentation

## 2024-05-25 DIAGNOSIS — E538 Deficiency of other specified B group vitamins: Secondary | ICD-10-CM

## 2024-05-25 DIAGNOSIS — I10 Essential (primary) hypertension: Secondary | ICD-10-CM

## 2024-05-25 DIAGNOSIS — Z23 Encounter for immunization: Secondary | ICD-10-CM

## 2024-05-25 DIAGNOSIS — Z0000 Encounter for general adult medical examination without abnormal findings: Secondary | ICD-10-CM

## 2024-05-25 DIAGNOSIS — E782 Mixed hyperlipidemia: Secondary | ICD-10-CM

## 2024-05-25 DIAGNOSIS — K219 Gastro-esophageal reflux disease without esophagitis: Secondary | ICD-10-CM

## 2024-05-25 DIAGNOSIS — D696 Thrombocytopenia, unspecified: Secondary | ICD-10-CM

## 2024-05-25 NOTE — Assessment & Plan Note (Signed)
 Weight decreased from 207 lbs to 198 lbs, indicating positive weight management. - Continue dietary modifications and weight monitoring.

## 2024-05-25 NOTE — Assessment & Plan Note (Signed)
 Stable on labs from May.  Repeat CBC ordered today.

## 2024-05-25 NOTE — Assessment & Plan Note (Signed)
 Blood pressure controlled at 120/72 mmHg.

## 2024-05-25 NOTE — Assessment & Plan Note (Signed)
 Symptoms remain well-controlled with pantoprazole
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
## 2024-05-25 NOTE — Assessment & Plan Note (Signed)
Currently on vitamin B12 supplementation.  Repeat vitamin B-12 level ordered today.

## 2024-05-25 NOTE — Assessment & Plan Note (Signed)
 High intensity statin therapy started during recent admission for TIA.  He is compliant with atorvastatin  40 mg daily. Recheck lipid panel today.

## 2024-05-25 NOTE — Progress Notes (Signed)
   Established Patient Office Visit  Subjective   Patient ID: Cameron Alexander, male    DOB: July 03, 1940  Age: 83 y.o. MRN: [id number]  Chief Complaint  Patient presents with  . Medical Management of Chronic Issues    Pt here for a Physical     HPI  Patient Active Problem List   Diagnosis Date Noted  . Chronic kidney disease, stage 3a (HCC) 11/18/2023  . Need for influenza vaccination 05/19/2023  . Thrombocytopenia 11/21/2022  . Ptosis of right eyelid 11/21/2022  . Essential hypertension 07/28/2022  . Anxiety 07/28/2022  . Vitamin B12 deficiency 04/15/2022  . Encounter to establish care 04/15/2022  . Diplopia   . 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 Gastroesophageal reflux disease   . Tobacco use disorder 04/04/2022  . TIA (transient ischemic attack) 04/03/2022      ROS    Objective:     BP (!) 159/76   Pulse (!) 106   Ht 5' 3 (1.6 m)   Wt 198 lb (89.8 kg)   SpO2 95%   BMI 35.07 kg/m  BP Readings from Last 3 Encounters:  05/25/24 (!) 159/76  11/18/23 128/62  05/19/23 138/78   Wt Readings from Last 3 Encounters:  05/25/24 198 lb (89.8 kg)  11/18/23 205 lb (93 kg)  05/19/23 207 lb (93.9 kg)     Physical Exam   No results found for any visits on 05/25/24.  Last CBC Lab Results  Component Value Date   WBC 6.7 08/11/2023   HGB 13.8 08/11/2023   HCT 42.3 08/11/2023   MCV 91 08/11/2023   MCH 29.8 08/11/2023   RDW 13.1 08/11/2023   PLT 194 08/11/2023   Last metabolic panel Lab Results  Component Value Date   GLUCOSE 131 (H) 11/18/2023   NA 141 11/18/2023   K 4.2 11/18/2023   CL 102 11/18/2023   CO2 23 11/18/2023   BUN 17 11/18/2023   CREATININE 1.46 (H) 11/18/2023   EGFR 47 (L) 11/18/2023   CALCIUM  9.7 11/18/2023   PROT 6.4 08/11/2023   ALBUMIN 4.3 08/11/2023   LABGLOB 2.1 08/11/2023   AGRATIO 1.6 11/21/2022   BILITOT 0.6 08/11/2023   ALKPHOS 108 08/11/2023   AST 14 08/11/2023   ALT 12 08/11/2023   ANIONGAP 4  (L) 04/04/2022   Last lipids Lab Results  Component Value Date   CHOL 106 08/11/2023   HDL 44 08/11/2023   LDLCALC 46 08/11/2023   TRIG 79 08/11/2023   CHOLHDL 2.4 08/11/2023   Last hemoglobin A1c Lab Results  Component Value Date   HGBA1C 5.3 08/11/2023   Last thyroid  functions Lab Results  Component Value Date   TSH 4.470 08/11/2023   FREET4 1.32 08/11/2023   Last vitamin D  Lab Results  Component Value Date   VD25OH 27.7 (L) 08/11/2023      The ASCVD Risk score (Arnett DK, et al., 2019) failed to calculate for the following reasons:   The 2019 ASCVD risk score is only valid for ages 47 to 90    Assessment & Plan:   Problem List Items Addressed This Visit   None Visit Diagnoses       Encounter for immunization    -  Primary   Relevant Orders   Flu vaccine HIGH DOSE PF(Fluzone Trivalent) (Completed)       No follow-ups on file.    Leita Longs, FNP

## 2024-06-16 LAB — HEMOGLOBIN A1C
Est. average glucose Bld gHb Est-mCnc: 111 mg/dL
Hgb A1c MFr Bld: 5.5 % (ref 4.8–5.6)

## 2024-06-16 LAB — CMP14+EGFR
ALT: 16 IU/L (ref 0–44)
AST: 16 IU/L (ref 0–40)
Albumin: 4.3 g/dL (ref 3.7–4.7)
Alkaline Phosphatase: 101 IU/L (ref 48–129)
BUN/Creatinine Ratio: 9 — ABNORMAL LOW (ref 10–24)
BUN: 13 mg/dL (ref 8–27)
Bilirubin Total: 0.8 mg/dL (ref 0.0–1.2)
CO2: 23 mmol/L (ref 20–29)
Calcium: 9.6 mg/dL (ref 8.6–10.2)
Chloride: 103 mmol/L (ref 96–106)
Creatinine, Ser: 1.38 mg/dL — ABNORMAL HIGH (ref 0.76–1.27)
Globulin, Total: 2.4 g/dL (ref 1.5–4.5)
Glucose: 125 mg/dL — ABNORMAL HIGH (ref 70–99)
Potassium: 4 mmol/L (ref 3.5–5.2)
Sodium: 139 mmol/L (ref 134–144)
Total Protein: 6.7 g/dL (ref 6.0–8.5)
eGFR: 50 mL/min/1.73 — ABNORMAL LOW

## 2024-06-16 LAB — B12 AND FOLATE PANEL
Folate: 8.9 ng/mL
Vitamin B-12: 1759 pg/mL — ABNORMAL HIGH (ref 232–1245)

## 2024-06-16 LAB — LIPID PANEL
Chol/HDL Ratio: 2.6 ratio (ref 0.0–5.0)
Cholesterol, Total: 121 mg/dL (ref 100–199)
HDL: 47 mg/dL
LDL Chol Calc (NIH): 56 mg/dL (ref 0–99)
Triglycerides: 97 mg/dL (ref 0–149)
VLDL Cholesterol Cal: 18 mg/dL (ref 5–40)

## 2024-06-16 LAB — CBC WITH DIFFERENTIAL/PLATELET
Basophils Absolute: 0 x10E3/uL (ref 0.0–0.2)
Basos: 0 %
EOS (ABSOLUTE): 0.3 x10E3/uL (ref 0.0–0.4)
Eos: 4 %
Hematocrit: 42.6 % (ref 37.5–51.0)
Hemoglobin: 14 g/dL (ref 13.0–17.7)
Immature Grans (Abs): 0 x10E3/uL (ref 0.0–0.1)
Immature Granulocytes: 0 %
Lymphocytes Absolute: 1.2 x10E3/uL (ref 0.7–3.1)
Lymphs: 20 %
MCH: 30.5 pg (ref 26.6–33.0)
MCHC: 32.9 g/dL (ref 31.5–35.7)
MCV: 93 fL (ref 79–97)
Monocytes Absolute: 0.4 x10E3/uL (ref 0.1–0.9)
Monocytes: 6 %
Neutrophils Absolute: 4.2 x10E3/uL (ref 1.4–7.0)
Neutrophils: 69 %
Platelets: 179 x10E3/uL (ref 150–450)
RBC: 4.59 x10E6/uL (ref 4.14–5.80)
RDW: 13.4 % (ref 11.6–15.4)
WBC: 6.1 x10E3/uL (ref 3.4–10.8)

## 2024-06-16 LAB — TSH+FREE T4
Free T4: 1.36 ng/dL (ref 0.82–1.77)
TSH: 4.22 u[IU]/mL (ref 0.450–4.500)

## 2024-06-16 LAB — VITAMIN D 25 HYDROXY (VIT D DEFICIENCY, FRACTURES): Vit D, 25-Hydroxy: 51.3 ng/mL (ref 30.0–100.0)

## 2024-07-17 ENCOUNTER — Other Ambulatory Visit: Payer: Self-pay

## 2024-07-17 DIAGNOSIS — K219 Gastro-esophageal reflux disease without esophagitis: Secondary | ICD-10-CM

## 2024-07-17 DIAGNOSIS — E782 Mixed hyperlipidemia: Secondary | ICD-10-CM

## 2024-07-17 DIAGNOSIS — I10 Essential (primary) hypertension: Secondary | ICD-10-CM

## 2024-11-23 ENCOUNTER — Ambulatory Visit
